# Patient Record
Sex: Female | Born: 1965
Health system: Southern US, Community
[De-identification: ages and names within clinical notes are randomized; demographics above are authoritative.]

## PROBLEM LIST (undated history)

## (undated) HISTORY — PX: BREAST CYST EXCISION: SHX579

---

## 1997-10-31 ENCOUNTER — Ambulatory Visit (HOSPITAL_COMMUNITY): Admission: RE | Admit: 1997-10-31 | Discharge: 1997-10-31 | Payer: Self-pay | Admitting: *Deleted

## 1998-06-30 ENCOUNTER — Ambulatory Visit (HOSPITAL_COMMUNITY): Admission: RE | Admit: 1998-06-30 | Discharge: 1998-06-30 | Payer: Self-pay | Admitting: Internal Medicine

## 1998-07-08 ENCOUNTER — Inpatient Hospital Stay (HOSPITAL_COMMUNITY): Admission: AD | Admit: 1998-07-08 | Discharge: 1998-07-09 | Payer: Self-pay | Admitting: Internal Medicine

## 2000-12-12 ENCOUNTER — Other Ambulatory Visit: Admission: RE | Admit: 2000-12-12 | Discharge: 2000-12-12 | Payer: Self-pay | Admitting: Internal Medicine

## 2004-10-27 ENCOUNTER — Encounter: Admission: RE | Admit: 2004-10-27 | Discharge: 2004-10-27 | Payer: Self-pay | Admitting: Internal Medicine

## 2005-08-04 ENCOUNTER — Other Ambulatory Visit: Admission: RE | Admit: 2005-08-04 | Discharge: 2005-08-04 | Payer: Self-pay | Admitting: Family Medicine

## 2007-01-04 ENCOUNTER — Other Ambulatory Visit: Admission: RE | Admit: 2007-01-04 | Discharge: 2007-01-04 | Payer: Self-pay | Admitting: Family Medicine

## 2007-02-24 ENCOUNTER — Encounter: Admission: RE | Admit: 2007-02-24 | Discharge: 2007-02-24 | Payer: Self-pay | Admitting: Family Medicine

## 2008-01-11 ENCOUNTER — Other Ambulatory Visit: Admission: RE | Admit: 2008-01-11 | Discharge: 2008-01-11 | Payer: Self-pay | Admitting: Family Medicine

## 2009-01-20 ENCOUNTER — Other Ambulatory Visit: Admission: RE | Admit: 2009-01-20 | Discharge: 2009-01-20 | Payer: Self-pay | Admitting: Family Medicine

## 2009-03-27 ENCOUNTER — Encounter: Admission: RE | Admit: 2009-03-27 | Discharge: 2009-03-27 | Payer: Self-pay | Admitting: Family Medicine

## 2009-04-03 ENCOUNTER — Ambulatory Visit: Payer: Self-pay | Admitting: Internal Medicine

## 2009-04-14 LAB — CBC WITH DIFFERENTIAL/PLATELET
Eosinophils Absolute: 0.3 10*3/uL (ref 0.0–0.5)
HCT: 35.8 % (ref 34.8–46.6)
MCH: 30.2 pg (ref 25.1–34.0)
MCHC: 33.2 g/dL (ref 31.5–36.0)
MONO#: 0.4 10*3/uL (ref 0.1–0.9)
MONO%: 5.3 % (ref 0.0–14.0)
RBC: 3.94 10*6/uL (ref 3.70–5.45)
RDW: 13.8 % (ref 11.2–14.5)
lymph#: 2.7 10*3/uL (ref 0.9–3.3)

## 2009-04-14 LAB — LACTATE DEHYDROGENASE: LDH: 140 U/L (ref 94–250)

## 2009-04-14 LAB — COMPREHENSIVE METABOLIC PANEL
ALT: 9 U/L (ref 0–35)
AST: 12 U/L (ref 0–37)
BUN: 10 mg/dL (ref 6–23)
CO2: 20 mEq/L (ref 19–32)
Chloride: 110 mEq/L (ref 96–112)
Glucose, Bld: 86 mg/dL (ref 70–99)
Potassium: 4.4 mEq/L (ref 3.5–5.3)

## 2010-01-22 ENCOUNTER — Other Ambulatory Visit: Admission: RE | Admit: 2010-01-22 | Discharge: 2010-01-22 | Payer: Self-pay | Admitting: Family Medicine

## 2010-04-17 ENCOUNTER — Encounter: Admission: RE | Admit: 2010-04-17 | Discharge: 2010-04-17 | Payer: Self-pay | Admitting: Family Medicine

## 2011-01-25 ENCOUNTER — Other Ambulatory Visit (HOSPITAL_COMMUNITY)
Admission: RE | Admit: 2011-01-25 | Discharge: 2011-01-25 | Disposition: A | Discharge status: Home / Self Care | Payer: 59 | Source: Ambulatory Visit | Attending: Family Medicine | Admitting: Family Medicine

## 2011-01-25 ENCOUNTER — Other Ambulatory Visit: Payer: Self-pay | Admitting: Family Medicine

## 2011-01-25 DIAGNOSIS — Z1231 Encounter for screening mammogram for malignant neoplasm of breast: Secondary | ICD-10-CM

## 2011-01-25 DIAGNOSIS — Z01419 Encounter for gynecological examination (general) (routine) without abnormal findings: Secondary | ICD-10-CM | POA: Insufficient documentation

## 2011-04-20 ENCOUNTER — Ambulatory Visit
Admission: RE | Admit: 2011-04-20 | Discharge: 2011-04-20 | Disposition: A | Discharge status: Home / Self Care | Payer: 59 | Source: Ambulatory Visit | Attending: Family Medicine | Admitting: Family Medicine

## 2011-04-20 DIAGNOSIS — Z1231 Encounter for screening mammogram for malignant neoplasm of breast: Secondary | ICD-10-CM

## 2013-03-21 ENCOUNTER — Other Ambulatory Visit: Payer: Self-pay

## 2013-03-21 DIAGNOSIS — Z1231 Encounter for screening mammogram for malignant neoplasm of breast: Secondary | ICD-10-CM

## 2013-04-16 ENCOUNTER — Ambulatory Visit
Admission: RE | Admit: 2013-04-16 | Discharge: 2013-04-16 | Disposition: A | Discharge status: Home / Self Care | Payer: BC Managed Care – PPO | Source: Ambulatory Visit

## 2013-04-16 DIAGNOSIS — Z1231 Encounter for screening mammogram for malignant neoplasm of breast: Secondary | ICD-10-CM

## 2014-02-21 ENCOUNTER — Other Ambulatory Visit: Payer: Self-pay | Admitting: Family Medicine

## 2014-02-21 ENCOUNTER — Other Ambulatory Visit (HOSPITAL_COMMUNITY)
Admission: RE | Admit: 2014-02-21 | Discharge: 2014-02-21 | Disposition: A | Discharge status: Home / Self Care | Payer: BC Managed Care – PPO | Source: Ambulatory Visit | Attending: Family Medicine | Admitting: Family Medicine

## 2014-02-21 DIAGNOSIS — Z0000 Encounter for general adult medical examination without abnormal findings: Secondary | ICD-10-CM | POA: Insufficient documentation

## 2014-02-27 LAB — CYTOLOGY - PAP

## 2014-04-19 ENCOUNTER — Other Ambulatory Visit: Payer: Self-pay

## 2014-04-19 DIAGNOSIS — Z1231 Encounter for screening mammogram for malignant neoplasm of breast: Secondary | ICD-10-CM

## 2014-04-26 ENCOUNTER — Ambulatory Visit
Admission: RE | Admit: 2014-04-26 | Discharge: 2014-04-26 | Disposition: A | Discharge status: Home / Self Care | Payer: BC Managed Care – PPO | Source: Ambulatory Visit

## 2014-04-26 DIAGNOSIS — Z1231 Encounter for screening mammogram for malignant neoplasm of breast: Secondary | ICD-10-CM

## 2015-04-21 ENCOUNTER — Other Ambulatory Visit: Payer: Self-pay

## 2015-04-21 DIAGNOSIS — Z1231 Encounter for screening mammogram for malignant neoplasm of breast: Secondary | ICD-10-CM

## 2015-04-28 ENCOUNTER — Ambulatory Visit
Admission: RE | Admit: 2015-04-28 | Discharge: 2015-04-28 | Disposition: A | Discharge status: Home / Self Care | Payer: BLUE CROSS/BLUE SHIELD | Source: Ambulatory Visit

## 2015-04-28 DIAGNOSIS — Z1231 Encounter for screening mammogram for malignant neoplasm of breast: Secondary | ICD-10-CM

## 2016-05-12 ENCOUNTER — Other Ambulatory Visit: Payer: Self-pay | Admitting: Family Medicine

## 2016-05-12 DIAGNOSIS — Z1231 Encounter for screening mammogram for malignant neoplasm of breast: Secondary | ICD-10-CM

## 2016-05-31 ENCOUNTER — Ambulatory Visit
Admission: RE | Admit: 2016-05-31 | Discharge: 2016-05-31 | Disposition: A | Discharge status: Home / Self Care | Payer: BLUE CROSS/BLUE SHIELD | Source: Ambulatory Visit | Attending: Family Medicine | Admitting: Family Medicine

## 2016-05-31 DIAGNOSIS — Z1231 Encounter for screening mammogram for malignant neoplasm of breast: Secondary | ICD-10-CM

## 2017-05-12 ENCOUNTER — Other Ambulatory Visit (HOSPITAL_COMMUNITY)
Admission: RE | Admit: 2017-05-12 | Discharge: 2017-05-12 | Disposition: A | Discharge status: Home / Self Care | Payer: BLUE CROSS/BLUE SHIELD | Source: Ambulatory Visit | Attending: Family Medicine | Admitting: Family Medicine

## 2017-05-12 ENCOUNTER — Other Ambulatory Visit: Payer: Self-pay | Admitting: Family Medicine

## 2017-05-12 DIAGNOSIS — Z124 Encounter for screening for malignant neoplasm of cervix: Secondary | ICD-10-CM | POA: Insufficient documentation

## 2017-05-16 ENCOUNTER — Other Ambulatory Visit: Payer: Self-pay | Admitting: Family Medicine

## 2017-05-16 DIAGNOSIS — Z1231 Encounter for screening mammogram for malignant neoplasm of breast: Secondary | ICD-10-CM

## 2017-05-16 LAB — CYTOLOGY - PAP: Diagnosis: NEGATIVE

## 2017-06-01 ENCOUNTER — Ambulatory Visit
Admission: RE | Admit: 2017-06-01 | Discharge: 2017-06-01 | Disposition: A | Discharge status: Home / Self Care | Payer: BLUE CROSS/BLUE SHIELD | Source: Ambulatory Visit | Attending: Family Medicine | Admitting: Family Medicine

## 2017-06-01 DIAGNOSIS — Z1231 Encounter for screening mammogram for malignant neoplasm of breast: Secondary | ICD-10-CM

## 2017-11-08 ENCOUNTER — Other Ambulatory Visit: Payer: Self-pay | Admitting: Occupational Medicine

## 2017-11-08 ENCOUNTER — Ambulatory Visit: Payer: Self-pay

## 2017-11-08 DIAGNOSIS — M25572 Pain in left ankle and joints of left foot: Secondary | ICD-10-CM

## 2017-11-15 DIAGNOSIS — M79672 Pain in left foot: Secondary | ICD-10-CM | POA: Insufficient documentation

## 2018-05-25 ENCOUNTER — Ambulatory Visit
Admission: RE | Admit: 2018-05-25 | Discharge: 2018-05-25 | Disposition: A | Discharge status: Home / Self Care | Payer: BLUE CROSS/BLUE SHIELD | Source: Ambulatory Visit | Attending: Family Medicine | Admitting: Family Medicine

## 2018-05-25 ENCOUNTER — Other Ambulatory Visit: Payer: Self-pay | Admitting: Family Medicine

## 2018-05-25 DIAGNOSIS — R0600 Dyspnea, unspecified: Secondary | ICD-10-CM

## 2019-03-16 ENCOUNTER — Ambulatory Visit (INDEPENDENT_AMBULATORY_CARE_PROVIDER_SITE_OTHER): Payer: BC Managed Care – PPO | Admitting: Pulmonary Disease

## 2019-03-16 ENCOUNTER — Encounter: Payer: Self-pay | Admitting: Pulmonary Disease

## 2019-03-16 ENCOUNTER — Other Ambulatory Visit: Payer: Self-pay

## 2019-03-16 VITALS — BP 130/72 | HR 100 | Temp 98.0°F | Ht 67.0 in | Wt 199.4 lb

## 2019-03-16 DIAGNOSIS — R062 Wheezing: Secondary | ICD-10-CM | POA: Diagnosis not present

## 2019-03-16 MED ORDER — BUDESONIDE-FORMOTEROL FUMARATE 160-4.5 MCG/ACT IN AERO: 2.0000 | INHALATION_SPRAY | Freq: Two times a day (BID) | RESPIRATORY_TRACT | 6 refills | Status: DC

## 2019-03-16 MED ORDER — PREDNISONE 20 MG PO TABS
ORAL_TABLET | ORAL | 0 refills | Status: DC
Start: 2019-03-16 — End: 2019-05-10

## 2019-03-16 MED ORDER — FLUTICASONE PROPIONATE 50 MCG/ACT NA SUSP: 2.0000 | Freq: Every day | NASAL | 5 refills | Status: AC

## 2019-03-16 MED ORDER — BUDESONIDE-FORMOTEROL FUMARATE 160-4.5 MCG/ACT IN AERO: 2.0000 | INHALATION_SPRAY | Freq: Two times a day (BID) | RESPIRATORY_TRACT | 0 refills | Status: DC

## 2019-03-16 NOTE — Patient Instructions (Signed)
Will increase your Symbicort to 160/4.5 Start Flonase nasal spray and Prilosec 40 mg once daily We will give prednisone 40 mg/day for 5 days  Check CBC with differential, IgE, PFTs Follow-up in 1 to 2 months.

## 2019-03-16 NOTE — Progress Notes (Signed)
Donna Mahoney    [id number]    May 10, 1966  Primary Care Physician:Reade, Herbie [place], MD  Referring Physician: Maury Dus, [address] [place],  [place] [address]  Chief complaint: Consult for asthma  HPI: 53 year old with history of eczema, plantar fasciitis Sent here for evaluation of persistent wheezing Chest seen by primary care in March with wheezing. Started on Singulair with no improvement in symptoms.  This was stopped on follow-up visit and April and started on Symbicort 80/4.5.  Patient states that she still symptomatic with daily symptoms of cough, white mucus, wheezing.  She does have seasonal allergies for which she is taking Xyzal and acid reflux.  Pets: No pets Occupation: Games developer Exposures: No known exposures, no mold, hot tub, Jacuzzi Smoking history: 10-pack-year smoker.  Quit in 2017 Travel history: No significant travel history Relevant family history: No significant family history of lung disease  ACQ-6 03/16/2019- 3.5  Outpatient Encounter Medications as of 03/16/2019  Medication Sig  . budesonide-formoterol (SYMBICORT) 80-4.5 MCG/ACT inhaler Inhale 2 puffs into the lungs 2 (two) times daily.   . montelukast (SINGULAIR) 10 MG tablet Take 10 mg by mouth daily.   No facility-administered encounter medications on file as of 03/16/2019.     Allergies as of 03/16/2019 - never reviewed  Allergen Reaction Noted  . Codeine Nausea And Vomiting 03/16/2019    History reviewed. No pertinent past medical history.  Past Surgical History:  Procedure Laterality Date  . BREAST CYST EXCISION Left    When she was 53 years old    Family History  Problem Relation Age of Onset  . Kidney disease Mother   . Breast cancer Sister     Social History   Socioeconomic History  . Marital status: Married    Spouse name: Not on file  . Number of children: Not on file  . Years of education: Not on file  . Highest education  level: Not on file  Occupational History  . Not on file  Social Needs  . Financial resource strain: Not on file  . Food insecurity    Worry: Not on file    Inability: Not on file  . Transportation needs    Medical: Not on file    Non-medical: Not on file  Tobacco Use  . Smoking status: Former Smoker    Packs/day: 0.25    Years: 23.00    Pack years: 5.75    Types: Cigarettes    Quit date: 03/19/2016    Years since quitting: 2.9  . Smokeless tobacco: Never Used  Substance and Sexual Activity  . Alcohol use: Yes    Comment: occ.  . Drug use: Never  . Sexual activity: Not on file  Lifestyle  . Physical activity    Days per week: Not on file    Minutes per session: Not on file  . Stress: Not on file  Relationships  . Social Herbalist on phone: Not on file    Gets together: Not on file    Attends religious service: Not on file    Active member of club or organization: Not on file    Attends meetings of clubs or organizations: Not on file    Relationship status: Not on file  . Intimate partner violence    Fear of current or ex partner: Not on file    Emotionally abused: Not on file    Physically abused: Not  on file    Forced sexual activity: Not on file  Other Topics Concern  . Not on file  Social History Narrative  . Not on file   Review of systems: Review of Systems  Constitutional: Negative for fever and chills.  HENT: Negative.   Eyes: Negative for blurred vision.  Respiratory: as per HPI  Cardiovascular: Negative for chest pain and palpitations.  Gastrointestinal: Negative for vomiting, diarrhea, blood per rectum. Genitourinary: Negative for dysuria, urgency, frequency and hematuria.  Musculoskeletal: Negative for myalgias, back pain and joint pain.  Skin: Negative for itching and rash.  Neurological: Negative for dizziness, tremors, focal weakness, seizures and loss of consciousness.  Endo/Heme/Allergies: Negative for environmental allergies.   Psychiatric/Behavioral: Negative for depression, suicidal ideas and hallucinations.  All other systems reviewed and are negative.  Physical Exam: Blood pressure 130/72, pulse 100, temperature 98 F (36.7 C), temperature source Oral, height 5\' 7"  (1.702 m), weight 199 lb 6.4 oz (90.4 kg), SpO2 97 %. Gen:      No acute distress HEENT:  EOMI, sclera anicteric Neck:     No masses; no thyromegaly Lungs:    Bilateral expiratory wheeze CV:         Regular rate and rhythm; no murmurs Abd:      + bowel sounds; soft, non-tender; no palpable masses, no distension Ext:    No edema; adequate peripheral perfusion Skin:      Warm and dry; no rash Neuro: alert and oriented x 3 Psych: normal mood and affect  Data Reviewed: Imaging: Chest x-ray 05/25/2018- no acute cardiopulmonary disease.  I have reviewed the images personally.  Labs: CBC 04/14/2009-WBC 6.9, eos 4.3%, absolute eosinophil 297  Assessment:  Severe persistent asthma with exacerbation We will give her a prednisone course-40 mg for 5 days Increase Symbicort 160/4.5. Start Flonase nasal spray for seasonal allergies and Prilosec for acid reflux  Check CBC differential, IgE and PFTs If symptoms remain uncontrolled pain we will consider biologic therapy  Plan/Recommendations: - Prednisone - Increase Symbicort 160 - Flonase, Prilosec - Check CBC, IgE, PFTs  Marshell Garfinkel MD [facility] 03/16/2019, 3:57 PM  CC: Maury Dus, MD

## 2019-03-19 LAB — CBC WITH DIFFERENTIAL/PLATELET
Absolute Monocytes: 621 cells/uL (ref 200–950)
Basophils Absolute: 90 cells/uL (ref 0–200)
Basophils Relative: 1 %
Eosinophils Absolute: 576 cells/uL — ABNORMAL HIGH (ref 15–500)
Eosinophils Relative: 6.4 %
HCT: 41.5 % (ref 35.0–45.0)
Hemoglobin: 13.6 g/dL (ref 11.7–15.5)
Lymphs Abs: 3843 cells/uL (ref 850–3900)
MCH: 29.9 pg (ref 27.0–33.0)
MCHC: 32.8 g/dL (ref 32.0–36.0)
MCV: 91.2 fL (ref 80.0–100.0)
Monocytes Relative: 6.9 %
Neutro Abs: 3870 cells/uL (ref 1500–7800)
Neutrophils Relative %: 43 %
Platelets: 175 10*3/uL (ref 140–400)
RBC: 4.55 10*6/uL (ref 3.80–5.10)
RDW: 13.3 % (ref 11.0–15.0)
Total Lymphocyte: 42.7 %
WBC: 9 10*3/uL (ref 3.8–10.8)

## 2019-03-19 LAB — IGE: IgE (Immunoglobulin E), Serum: 88 kU/L (ref <=114)

## 2019-03-27 ENCOUNTER — Telehealth: Payer: Self-pay | Admitting: Pulmonary Disease

## 2019-03-27 NOTE — Telephone Encounter (Signed)
Pt returning call for results of IgE. Resulted in results section. Pt notified Nothing further needed.

## 2019-03-28 ENCOUNTER — Institutional Professional Consult (permissible substitution): Payer: Self-pay | Admitting: Pulmonary Disease

## 2019-04-17 ENCOUNTER — Telehealth: Payer: Self-pay | Admitting: Pulmonary Disease

## 2019-04-18 NOTE — Telephone Encounter (Signed)
Pt is returning call. Cb is [phone] (Cell)

## 2019-04-19 NOTE — Telephone Encounter (Signed)
Spoke to pt scheduled PFT on 05/10/2019-pr

## 2019-05-03 ENCOUNTER — Other Ambulatory Visit: Payer: Self-pay | Admitting: Pulmonary Disease

## 2019-05-07 ENCOUNTER — Other Ambulatory Visit (HOSPITAL_COMMUNITY): Payer: BC Managed Care – PPO | Attending: Pulmonary Disease

## 2019-05-07 ENCOUNTER — Other Ambulatory Visit: Payer: Self-pay

## 2019-05-07 DIAGNOSIS — Z20822 Contact with and (suspected) exposure to covid-19: Secondary | ICD-10-CM

## 2019-05-09 ENCOUNTER — Telehealth: Payer: Self-pay | Admitting: Family Medicine

## 2019-05-09 LAB — NOVEL CORONAVIRUS, NAA: SARS-CoV-2, NAA: NOT DETECTED

## 2019-05-09 NOTE — Telephone Encounter (Signed)
Negative COVID results given. Patient results "NOT Detected." Caller expressed understanding. ° °

## 2019-05-09 NOTE — Progress Notes (Signed)
@Patient  ID: Donna Mahoney, female    DOB: 03/29/1966, 53 y.o.   MRN: [id number]  Chief Complaint  Patient presents with  . Follow-up    PFT results SOB mostly with cough - coughs a lot (mininal phlegm - dark tan) - prednisone helped but now worse again    Referring provider: Maury Dus, MD  HPI:  53 year old female former smoker followed in our office for asthma  Smoker/ Smoking History: Former smoker.  Quit 2017.  5.75 pack years. Maintenance:  Symbicort 160 Pt of: Dr. Vaughan Browner  05/10/2019  - Visit   52 year old female former smoker presenting to our office today for further work-up regarding her cough, wheezing as well as suspected asthma.  Patient complete a pulmonary function test today.  Pulmonary function test results are listed below:  05/10/2019-pulmonary function test-FVC 1.90 (67% predicted), postbronchodilator ratio 83, postbronchodilator FEV1 1.52 (68% predicted), no bronchodilator response, mid flow reversibility, DLCO 14.60 (70% predicted)  Overall, patient reports that she was feeling well and the symptoms had significantly improved after last office visit with Dr. Vaughan Browner.  Patient was treated with prednisone as well as started on Symbicort 160.  Patient reports that she ran out of Symbicort 160 and did not realize she was supposed to be maintained on this.  She also did not know that she had a standing prescription at the pharmacy.  Patient reports that symptoms have worsened since coming off of the Symbicort inhaler.  Patient also does not have a rescue inhaler at home.  Patient has recent blood work from June/2020 that shows slightly elevated IgE of 88 as well as peripheral eosinophilia.   Tests:   05/07/2019-SARS-CoV-2-negative  03/16/2019-IgE-88 03/16/2019- eosinophils relative 6.4, eosinophils absolute 576  05/10/2019-pulmonary function test-FVC 1.90 (67% predicted), postbronchodilator ratio 83, postbronchodilator FEV1 1.52 (68% predicted), no  bronchodilator response, mid flow reversibility, DLCO 14.60 (70% predicted)  FENO:  No results found for: NITRICOXIDE  PFT: PFT Results Latest Ref Rng & Units 05/10/2019  FVC-Pre L 1.90  FVC-Predicted Pre % 67  FVC-Post L 1.84  FVC-Predicted Post % 65  Pre FEV1/FVC % % 76  Post FEV1/FCV % % 83  FEV1-Pre L 1.45  FEV1-Predicted Pre % 64  FEV1-Post L 1.52  DLCO UNC% % 70  DLCO COR %Predicted % 97  TLC L 3.89  TLC % Predicted % 76  RV % Predicted % 103    Imaging: No results found.    Specialty Problems      Pulmonary Problems   Severe persistent asthma with acute exacerbation    03/16/2019-IgE-88 03/16/2019- eosinophils relative 6.4, eosinophils absolute 576  05/10/2019-pulmonary function test-FVC 1.90 (67% predicted), postbronchodilator ratio 83, postbronchodilator FEV1 1.52 (68% predicted), no bronchodilator response, mid flow reversibility, DLCO 14.60 (70% predicted)           Allergies  Allergen Reactions  . Codeine Nausea And Vomiting    Immunization History  Administered Date(s) Administered  . Influenza-Unspecified 05/20/2017, 06/15/2018   Needs flu vaccine and Pneumovax 23 at next office visit if clinically stable.  History reviewed. No pertinent past medical history.  Tobacco History: Social History   Tobacco Use  Smoking Status Former Smoker  . Packs/day: 0.25  . Years: 23.00  . Pack years: 5.75  . Types: Cigarettes  . Quit date: 03/19/2016  . Years since quitting: 3.1  Smokeless Tobacco Never Used   Counseling given: Yes  Continue to not smoke  Outpatient Encounter Medications as of 05/10/2019  Medication Sig  . budesonide-formoterol (  SYMBICORT) 160-4.5 MCG/ACT inhaler Inhale 2 puffs into the lungs 2 (two) times daily.  . fluticasone (FLONASE) 50 MCG/ACT nasal spray Place 2 sprays into both nostrils daily.  . montelukast (SINGULAIR) 10 MG tablet Take 10 mg by mouth daily.  . [DISCONTINUED] budesonide-formoterol (SYMBICORT) 160-4.5  MCG/ACT inhaler Inhale 2 puffs into the lungs 2 (two) times daily.  . [DISCONTINUED] predniSONE (DELTASONE) 20 MG tablet Take 40 mg daily for 5 days  . albuterol (VENTOLIN HFA) 108 (90 Base) MCG/ACT inhaler Inhale 2 puffs into the lungs every 6 (six) hours as needed for wheezing or shortness of breath.  . budesonide-formoterol (SYMBICORT) 160-4.5 MCG/ACT inhaler Inhale 2 puffs into the lungs 2 (two) times daily.  . budesonide-formoterol (SYMBICORT) 80-4.5 MCG/ACT inhaler Inhale 2 puffs into the lungs 2 (two) times daily.   . predniSONE (DELTASONE) 20 MG tablet Take 2 tablets by mouth (40mg  daily) for 5 days. Take in the morning with food.   No facility-administered encounter medications on file as of 05/10/2019.      Review of Systems  Review of Systems  Constitutional: Positive for fatigue. Negative for activity change and fever.  HENT: Negative for sinus pressure, sinus pain and sore throat.   Respiratory: Positive for cough, chest tightness, shortness of breath and wheezing.   Cardiovascular: Negative for chest pain and palpitations.  Gastrointestinal: Negative for diarrhea, nausea and vomiting.  Musculoskeletal: Negative for arthralgias.  Neurological: Negative for dizziness.  Psychiatric/Behavioral: Negative for sleep disturbance. The patient is not nervous/anxious.      Physical Exam  BP 120/78 (BP Location: Right Arm, Patient Position: Sitting, Cuff Size: Normal)   Pulse (!) 101   Temp 98 F (36.7 C)   Ht 5\' 4"  (1.626 m)   Wt 199 lb (90.3 kg)   SpO2 95%   BMI 34.16 kg/m   Wt Readings from Last 5 Encounters:  05/10/19 199 lb (90.3 kg)  03/16/19 199 lb 6.4 oz (90.4 kg)     Physical Exam Vitals signs and nursing note reviewed.  Constitutional:      General: She is not in acute distress.    Appearance: Normal appearance. She is obese.  HENT:     Head: Normocephalic and atraumatic.     Right Ear: Tympanic membrane, ear canal and external ear normal. There is no  impacted cerumen.     Left Ear: Tympanic membrane, ear canal and external ear normal. There is no impacted cerumen.     Nose: Congestion and rhinorrhea present.     Mouth/Throat:     Mouth: Mucous membranes are moist.     Pharynx: Oropharynx is clear.     Comments: Postnasal drip Eyes:     Pupils: Pupils are equal, round, and reactive to light.  Neck:     Musculoskeletal: Normal range of motion.  Cardiovascular:     Rate and Rhythm: Normal rate and regular rhythm.     Pulses: Normal pulses.     Heart sounds: Normal heart sounds. No murmur.  Pulmonary:     Effort: Pulmonary effort is normal. No respiratory distress.     Breath sounds: No decreased air movement. Wheezing (Expiratory wheezes) present. No decreased breath sounds or rales.  Skin:    General: Skin is warm and dry.     Capillary Refill: Capillary refill takes less than 2 seconds.  Neurological:     General: No focal deficit present.     Mental Status: She is alert and oriented to person, place, and time. Mental  status is at baseline.     Gait: Gait normal.  Psychiatric:        Mood and Affect: Mood normal.        Behavior: Behavior normal.        Thought Content: Thought content normal.        Judgment: Judgment normal.      Lab Results:  CBC    Component Value Date/Time   WBC 9.0 03/16/2019 1619   RBC 4.55 03/16/2019 1619   HGB 13.6 03/16/2019 1619   HGB 11.9 04/14/2009 0934   HCT 41.5 03/16/2019 1619   HCT 35.8 04/14/2009 0934   PLT 175 03/16/2019 1619   PLT 145 04/14/2009 0934   MCV 91.2 03/16/2019 1619   MCV 90.9 04/14/2009 0934   MCH 29.9 03/16/2019 1619   MCHC 32.8 03/16/2019 1619   RDW 13.3 03/16/2019 1619   RDW 13.8 04/14/2009 0934   LYMPHSABS 3,843 03/16/2019 1619   LYMPHSABS 2.7 04/14/2009 0934   MONOABS 0.4 04/14/2009 0934   EOSABS 576 (H) 03/16/2019 1619   EOSABS 0.3 04/14/2009 0934   BASOSABS 90 03/16/2019 1619   BASOSABS 0.1 04/14/2009 0934    BMET    Component Value Date/Time    NA 140 04/14/2009 0934   K 4.4 04/14/2009 0934   CL 110 04/14/2009 0934   CO2 20 04/14/2009 0934   GLUCOSE 86 04/14/2009 0934   BUN 10 04/14/2009 0934   CREATININE 0.91 04/14/2009 0934   CALCIUM 9.0 04/14/2009 0934    BNP No results found for: BNP  ProBNP No results found for: PROBNP    Assessment & Plan:   Severe persistent asthma with acute exacerbation Plan: Prednisone 40 mg for 5 days Resume Symbicort 160, sample provided today Use spacer with Symbicort Prescription for rescue inhaler sent to pharmacy May need to consider biologic therapies in future based off of peripheral eosinophilia on blood work in June/2020 Follow-up with our office in 4 to 6 weeks to ensure symptoms are improving Flu vaccine as well as pneumonia vaccine at next office visit if stable and off of steroids.  Healthcare maintenance Plan: Needs flu vaccine in the fall Needs Pneumovax 23 at next office visit if clinically stable    Return in about 6 weeks (around 06/21/2019), or if symptoms worsen or fail to improve, for Follow up with Wyn Quaker FNP-C, Follow up with Dr. Vaughan Browner.   Lauraine Rinne, NP 05/10/2019   This appointment was 27 minutes long with over 50% of the time in direct face-to-face patient care, assessment, plan of care, and follow-up.

## 2019-05-10 ENCOUNTER — Ambulatory Visit (INDEPENDENT_AMBULATORY_CARE_PROVIDER_SITE_OTHER): Payer: BC Managed Care – PPO | Admitting: Pulmonary Disease

## 2019-05-10 ENCOUNTER — Ambulatory Visit: Payer: BC Managed Care – PPO | Admitting: Pulmonary Disease

## 2019-05-10 ENCOUNTER — Other Ambulatory Visit: Payer: Self-pay

## 2019-05-10 ENCOUNTER — Encounter: Payer: Self-pay | Admitting: Pulmonary Disease

## 2019-05-10 VITALS — BP 120/78 | HR 101 | Temp 98.0°F | Ht 64.0 in | Wt 199.0 lb

## 2019-05-10 DIAGNOSIS — Z0000 Encounter for general adult medical examination without abnormal findings: Secondary | ICD-10-CM

## 2019-05-10 DIAGNOSIS — R062 Wheezing: Secondary | ICD-10-CM

## 2019-05-10 DIAGNOSIS — J4551 Severe persistent asthma with (acute) exacerbation: Secondary | ICD-10-CM | POA: Diagnosis not present

## 2019-05-10 DIAGNOSIS — J4550 Severe persistent asthma, uncomplicated: Secondary | ICD-10-CM | POA: Insufficient documentation

## 2019-05-10 LAB — PULMONARY FUNCTION TEST
DL/VA % pred: 97 %
DL/VA: 4.17 ml/min/mmHg/L
DLCO cor % pred: 70 %
DLCO cor: 14.51 ml/min/mmHg
DLCO unc % pred: 70 %
DLCO unc: 14.6 ml/min/mmHg
FEF 25-75 Post: 1.49 L/sec
FEF 25-75 Pre: 1.11 L/sec
FEF2575-%Change-Post: 33 %
FEF2575-%Pred-Post: 64 %
FEF2575-%Pred-Pre: 48 %
FEV1-%Change-Post: 5 %
FEV1-%Pred-Post: 68 %
FEV1-%Pred-Pre: 64 %
FEV1-Post: 1.52 L
FEV1-Pre: 1.45 L
FEV1FVC-%Change-Post: 8 %
FEV1FVC-%Pred-Pre: 94 %
FEV6-%Change-Post: -3 %
FEV6-%Pred-Post: 67 %
FEV6-%Pred-Pre: 69 %
FEV6-Post: 1.84 L
FEV6-Pre: 1.9 L
FEV6FVC-%Pred-Post: 103 %
FEV6FVC-%Pred-Pre: 103 %
FVC-%Change-Post: -3 %
FVC-%Pred-Post: 65 %
FVC-%Pred-Pre: 67 %
FVC-Post: 1.84 L
FVC-Pre: 1.9 L
Post FEV1/FVC ratio: 83 %
Post FEV6/FVC ratio: 100 %
Pre FEV1/FVC ratio: 76 %
Pre FEV6/FVC Ratio: 100 %
RV % pred: 103 %
RV: 1.93 L
TLC % pred: 76 %
TLC: 3.89 L

## 2019-05-10 MED ORDER — PREDNISONE 20 MG PO TABS: ORAL_TABLET | ORAL | 10 refills | Status: DC

## 2019-05-10 MED ORDER — ALBUTEROL SULFATE HFA 108 (90 BASE) MCG/ACT IN AERS: 2.0000 | INHALATION_SPRAY | Freq: Four times a day (QID) | RESPIRATORY_TRACT | 2 refills | Status: DC | PRN

## 2019-05-10 MED ORDER — BUDESONIDE-FORMOTEROL FUMARATE 160-4.5 MCG/ACT IN AERO: 2.0000 | INHALATION_SPRAY | Freq: Two times a day (BID) | RESPIRATORY_TRACT | 0 refills | Status: DC

## 2019-05-10 NOTE — Progress Notes (Signed)
PFT done today. 

## 2019-05-10 NOTE — Assessment & Plan Note (Signed)
Plan: Prednisone 40 mg for 5 days Resume Symbicort 160, sample provided today Use spacer with Symbicort Prescription for rescue inhaler sent to pharmacy May need to consider biologic therapies in future based off of peripheral eosinophilia on blood work in [place] with our office in 4 to 6 weeks to ensure symptoms are improving Flu vaccine as well as pneumonia vaccine at next office visit if stable and off of steroids.

## 2019-05-10 NOTE — Assessment & Plan Note (Signed)
Plan: Needs flu vaccine in the fall Needs Pneumovax 23 at next office visit if clinically stable

## 2019-05-10 NOTE — Patient Instructions (Addendum)
You were seen today by Lauraine Rinne, NP  for:   1. Severe persistent asthma with acute exacerbation  Restart Symbicort 160 >>> 2 puffs in the morning right when you wake up, rinse out your mouth after use, 12 hours later 2 puffs, rinse after use >>> Take this daily, no matter what >>> This is not a rescue inhaler    Only use your albuterol as a rescue medication to be used if you can't catch your breath by resting or doing a relaxed purse lip breathing pattern.  - The less you use it, the better it will work when you need it. - Ok to use up to 2 puffs  every 4 hours if you must but call for immediate appointment if use goes up over your usual need - Don't leave home without it !!  (think of it like the spare tire for your car)    - predniSONE (DELTASONE) 20 MG tablet; Take 2 tablets by mouth (40mg  daily) for 5 days. Take in the morning with food.  Dispense: 2 tablet; Refill: 10   Continue Singulair daily   Continue Flonase   Flu vaccine and Pneumovax23 at next office visit   We recommend today:  No orders of the defined types were placed in this encounter.  No orders of the defined types were placed in this encounter.  Meds ordered this encounter  Medications  . predniSONE (DELTASONE) 20 MG tablet    Sig: Take 2 tablets by mouth (40mg  daily) for 5 days. Take in the morning with food.    Dispense:  2 tablet    Refill:  10  . budesonide-formoterol (SYMBICORT) 160-4.5 MCG/ACT inhaler    Sig: Inhale 2 puffs into the lungs 2 (two) times daily.    Dispense:  1 Inhaler    Refill:  0    Order Specific Question:   Lot Number?    Answer:   [id number]    Order Specific Question:   Expiration Date?    Answer:   11/19/2019    Order Specific Question:   Manufacturer?    Answer:   [employer] [[id number]]    Order Specific Question:   Quantity    Answer:   1    Comments:   sample  . albuterol (VENTOLIN HFA) 108 (90 Base) MCG/ACT inhaler    Sig: Inhale 2 puffs into the lungs every 6 (six)  hours as needed for wheezing or shortness of breath.    Dispense:  18 g    Refill:  2    Follow Up:    Return in about 6 weeks (around 06/21/2019), or if symptoms worsen or fail to improve, for Follow up with Wyn Quaker FNP-C, Follow up with Dr. Vaughan Browner.   Please do your part to reduce the spread of COVID-19:      Reduce your risk of any infection  and COVID19 by using the similar precautions used for avoiding the common cold or flu:  Marland Kitchen Wash your hands often with soap and warm water for at least 20 seconds.  If soap and water are not readily available, use an alcohol-based hand sanitizer with at least 60% alcohol.  . If coughing or sneezing, cover your mouth and nose by coughing or sneezing into the elbow areas of your shirt or coat, into a tissue or into your sleeve (not your hands). Langley Gauss A MASK when in public  . Avoid shaking hands with others and consider head nods or verbal greetings only. Marland Kitchen  Avoid touching your eyes, nose, or mouth with unwashed hands.  . Avoid close contact with people who are sick. . Avoid places or events with large numbers of people in one location, like concerts or sporting events. . If you have some symptoms but not all symptoms, continue to monitor at home and seek medical attention if your symptoms worsen. . If you are having a medical emergency, call 911.   [place] / e-Visit: [email or web address]         [facility]: [facility]: [phone]                   [facility]: [phone]     It is flu season:   >>> Best ways to protect herself from the flu: Receive the yearly flu vaccine, practice good hand hygiene washing with soap and also using hand sanitizer when available, eat a nutritious meals, get adequate rest, hydrate appropriately   Please contact the office if your symptoms worsen or  you have concerns that you are not improving.   Thank you for choosing [facility] for your healthcare, and for allowing [place] to partner with you on your healthcare journey. I am thankful to be able to provide care to you today.   Wyn Quaker FNP-C

## 2019-05-22 ENCOUNTER — Telehealth: Payer: Self-pay | Admitting: Pulmonary Disease

## 2019-05-22 NOTE — Telephone Encounter (Signed)
lmtcb x1 

## 2019-05-22 NOTE — Telephone Encounter (Signed)
Patient returned call and was informed: She needs to contact her insurance plan to find out what inhalers are on formulary in same class as Symbicort. Informed she needs to get cost of inhalers so that she can decide whether it's something she can afford. She is to then call office back with updated information. Nothing further needed at this time.

## 2019-05-23 ENCOUNTER — Telehealth: Payer: Self-pay | Admitting: Pulmonary Disease

## 2019-05-23 DIAGNOSIS — J4551 Severe persistent asthma with (acute) exacerbation: Secondary | ICD-10-CM

## 2019-05-23 NOTE — Telephone Encounter (Signed)
Pt states Symbicort is to expensive. She did call her insurance company. She says Asmanex is on formulary and wants to know if medication can be changed to that?

## 2019-05-24 NOTE — Telephone Encounter (Signed)
Pending provider response 

## 2019-05-25 NOTE — Telephone Encounter (Signed)
Asmanex is not adequate treatment for severe asthma Please refer to pharmacy so that they can evaluate options for her.  She will need to be on inhaled corticosteroid and LABA

## 2019-05-25 NOTE — Telephone Encounter (Signed)
Contacted patient by phone and notified her that the pharmacy team would be contacting her to assist in finding an affordable inhaler to manage her asthma.  Patient acknowledged understanding and had no further questions.  Referral placed to pharmacy. Will close encounter.

## 2019-05-30 ENCOUNTER — Telehealth: Payer: Self-pay | Admitting: Pulmonary Disease

## 2019-05-30 DIAGNOSIS — R062 Wheezing: Secondary | ICD-10-CM

## 2019-05-30 DIAGNOSIS — J4551 Severe persistent asthma with (acute) exacerbation: Secondary | ICD-10-CM

## 2019-05-30 MED ORDER — BUDESONIDE-FORMOTEROL FUMARATE 160-4.5 MCG/ACT IN AERO: 2.0000 | INHALATION_SPRAY | Freq: Two times a day (BID) | RESPIRATORY_TRACT | 0 refills | Status: DC

## 2019-05-30 NOTE — Telephone Encounter (Signed)
Patient was given a sample of Symbicort 160 during OV with Dr. Vaughan Browner on 05/10/19. There is a telephone note dated 05/23/19 stating the patient was told Asmanex would not be appropriate for her and she will be contacted by pharmacy team. There are no current appointments scheduled with pharmacy team at this time. However, she does have an appointment with Dr. Vaughan Browner 06/21/19.  I left a message for the patient to pick up a sample of Symbicort 160/4.5 mg from front desk, which should help her until she has been in contact with pharmacy team.  Asked for patient to call back if there are any other questions. Nothing further needed at this time.

## 2019-06-21 ENCOUNTER — Other Ambulatory Visit: Payer: Self-pay

## 2019-06-21 ENCOUNTER — Encounter: Payer: Self-pay | Admitting: Pulmonary Disease

## 2019-06-21 ENCOUNTER — Ambulatory Visit (INDEPENDENT_AMBULATORY_CARE_PROVIDER_SITE_OTHER): Payer: BC Managed Care – PPO

## 2019-06-21 ENCOUNTER — Ambulatory Visit: Payer: BC Managed Care – PPO | Admitting: Pulmonary Disease

## 2019-06-21 VITALS — BP 118/74 | HR 88 | Temp 97.6°F | Ht 66.0 in | Wt 202.0 lb

## 2019-06-21 DIAGNOSIS — J4551 Severe persistent asthma with (acute) exacerbation: Secondary | ICD-10-CM | POA: Diagnosis not present

## 2019-06-21 DIAGNOSIS — Z23 Encounter for immunization: Secondary | ICD-10-CM

## 2019-06-21 MED ORDER — BUDESONIDE-FORMOTEROL FUMARATE 160-4.5 MCG/ACT IN AERO: 2.0000 | INHALATION_SPRAY | Freq: Two times a day (BID) | RESPIRATORY_TRACT | 0 refills | Status: DC

## 2019-06-21 NOTE — Addendum Note (Signed)
Addended by: Hildred Alamin I on: 06/21/2019 09:34 AM   Modules accepted: Orders

## 2019-06-21 NOTE — Patient Instructions (Addendum)
Continue the Symbicort.  Please check with your pharmacy if there is any other similar medication which may be cheaper and we can switch to that Continue Singulair, antiacid medication We will give vaccination today  Follow-up in 3 months.

## 2019-06-21 NOTE — Addendum Note (Signed)
Addended by: Hildred Alamin I on: 06/21/2019 09:33 AM   Modules accepted: Orders

## 2019-06-21 NOTE — Progress Notes (Signed)
Donna Mahoney    [id number]    1966/01/27  Primary Care Physician:Reade, Herbie [place], MD  Referring Physician: Maury Dus, [place],  [place] [address]  Chief complaint: Follow up for asthma  HPI: 53 year old with history of eczema, plantar fasciitis Sent here for evaluation of persistent wheezing Chest seen by primary care in March with wheezing. Started on Singulair with no improvement in symptoms.  This was stopped on follow-up visit and April and started on Symbicort 80/4.5.  Patient states that she still symptomatic with daily symptoms of cough, white mucus, wheezing.  She does have seasonal allergies for which she is taking Xyzal and acid reflux.  Pets: No pets Occupation: Games developer Exposures: No known exposures, no mold, hot tub, Jacuzzi Smoking history: 10-pack-year smoker.  Quit in 2017 Travel history: No significant travel history Relevant family history: No significant family history of lung disease  ACQ-6 03/16/2019- 3.5 ACT- 06/21/2019-21  Interim history: Got 2 rounds of prednisone over the past few months for asthma.  Her symptoms are finally under control. We increased her Symbicort 160.  She is using this 2 puffs twice daily Continues on Singulair, antiacid medication.  Outpatient Encounter Medications as of 06/21/2019  Medication Sig  . albuterol (VENTOLIN HFA) 108 (90 Base) MCG/ACT inhaler Inhale 2 puffs into the lungs every 6 (six) hours as needed for wheezing or shortness of breath.  . budesonide-formoterol (SYMBICORT) 160-4.5 MCG/ACT inhaler Inhale 2 puffs into the lungs 2 (two) times daily.  . fluticasone (FLONASE) 50 MCG/ACT nasal spray Place 2 sprays into both nostrils daily.  . montelukast (SINGULAIR) 10 MG tablet Take 10 mg by mouth daily.  . [DISCONTINUED] budesonide-formoterol (SYMBICORT) 160-4.5 MCG/ACT inhaler Inhale 2 puffs into the lungs 2 (two) times daily.  . [DISCONTINUED]  budesonide-formoterol (SYMBICORT) 160-4.5 MCG/ACT inhaler Inhale 2 puffs into the lungs 2 (two) times daily.  . [DISCONTINUED] budesonide-formoterol (SYMBICORT) 80-4.5 MCG/ACT inhaler Inhale 2 puffs into the lungs 2 (two) times daily.   . [DISCONTINUED] predniSONE (DELTASONE) 20 MG tablet Take 2 tablets by mouth (40mg  daily) for 5 days. Take in the morning with food.   No facility-administered encounter medications on file as of 06/21/2019.    Physical Exam: Blood pressure 130/72, pulse 100, temperature 98 F (36.7 C), temperature source Oral, height 5\' 7"  (1.702 m), weight 199 lb 6.4 oz (90.4 kg), SpO2 97 %. Gen:      No acute distress HEENT:  EOMI, sclera anicteric Neck:     No masses; no thyromegaly Lungs:    Bilateral expiratory wheeze CV:         Regular rate and rhythm; no murmurs Abd:      + bowel sounds; soft, non-tender; no palpable masses, no distension Ext:    No edema; adequate peripheral perfusion Skin:      Warm and dry; no rash Neuro: alert and oriented x 3 Psych: normal mood and affect  Data Reviewed: Imaging: Chest x-ray 05/25/2018- no acute cardiopulmonary disease.  I have reviewed the images personally.  PFTs: 05/10/2019 FVC 1.84 [65%], FEV1 1.52 [68%], F/F 83, TLC 3.89 [76%], DLCO 14.6 [70%] Small airways disease with air trapping.  Mild restriction and diffusion impairment.  Labs: CBC 04/14/2009-WBC 6.9, eos 4.3%, absolute eosinophil 297 CBC 6-20 6/20-WBC 9, eos 6.4%, absolute eosinophil count 576 IgE 03/16/2019-88  Assessment:  Severe persistent asthma T2 high phenotype Continue Symbicort 160/4.5.  She feels that this inhaler is too expensive.  She will check with her insurance and pharmacy regarding preferred inhalers and get back to [place]. Flonase, Singulair for allergic rhinitis Prilosec for acid reflux  PFTs reviewed with no overt obstruction but evidence of small airways disease given curvature, improvement in mid flow lesions and air trapping.  There is  mild restriction in diffusion impairment.  Suspicion for interstitial lung disease is low. Get chest x-ray today If symptoms get out of control after maximal inhaler therapy then consider Biologics.  Health maintenance Flu vaccine, Pneumovax today  Plan/Recommendations: - Symbicort, albuterol as needed - Flonase, Singulair, Prilosec - Chest x-ray - Flu, Pneumovax  Marshell Garfinkel MD [facility] 06/21/2019, 9:02 AM  CC: Maury Dus, MD

## 2019-06-22 ENCOUNTER — Telehealth: Payer: Self-pay | Admitting: Pulmonary Disease

## 2019-06-22 NOTE — Telephone Encounter (Signed)
Called and spoke w/ pt regarding her CXR results from 06/21/2019 per Dr. Vaughan Browner. Pt verbalized understanding to these results with no additional questions or concerns. I have updated the result note to reflect this. Nothing further needed at this time.

## 2019-07-10 ENCOUNTER — Telehealth: Payer: Self-pay | Admitting: Pharmacy Technician

## 2019-07-10 NOTE — Telephone Encounter (Signed)
Findings of benefits investigation via test claims at [facility]:   Insurance: [employer]   Symbicort 160mg  - # 1 inhaler for a 1 month supply through patient's insurance is $ 45.00.  Budesonide-Formoterol 160mg - (Generic for Symbicort) Non-formulary- brand preferred.   Dulera - # 1 inhaler for a 1 month supply through patient's insurance is $ 45.00.  Advair 250- # 1 inhaler for a 1 month supply through patient's insurance is $ 10.00.   *Patient has Pharmacist, community. Ruthe Mannan has a copay card available to may copays no more than $15/month.   3:45 PM Beatriz Chancellor, CPhT

## 2019-07-10 NOTE — Progress Notes (Deleted)
Pharmacy Note  Subjective:  Patient presents today to [facility] to see pharmacy team for inhaler optimization and education.  Past medical history includes severe persistent asthma and eczema.  She is a former smoker with a 10 pack year history and quit in 2017.  Her current asthma regimen includes Symbicort, Ventolin, Flonase, and Singulair (Prilosec?).  She has received 2 prednisone prescriptions since June.  Triggers: *** Coughing at night ***days/week Coughing with exertion *** Chest pain*** ER visits in last 6 months *** Hospitalizations ***  Asthma Control (past 4 weeks)  Asthma gets in the way of finishing tasks (school, work, home): {How much time:3044014::"all of the time","most of the time","some of the time","a little of the time","none of the time"}  Shortness of breath: {How often:3044014::"more than once a day","once a day","3-6 times per week","once or twice a week","not at all"}    Symptoms causing nighttime awakening: {How often:3044014::"4 or more nights/week","2-3 nights/week","once a week","1-2 nights/month","not at all"}   Frequency of rescue inhaler use: {How often:3044014::"3 or more times/day","1-2 times/day","2-3 times/week","once a week or less","not at all"}   Asthma control: {Rating:3044014::"not controlled","poor","somewhat","well","completely"}   Objective: Outpatient Encounter Medications as of 07/13/2019  Medication Sig  . albuterol (VENTOLIN HFA) 108 (90 Base) MCG/ACT inhaler Inhale 2 puffs into the lungs every 6 (six) hours as needed for wheezing or shortness of breath.  . budesonide-formoterol (SYMBICORT) 160-4.5 MCG/ACT inhaler Inhale 2 puffs into the lungs 2 (two) times daily.  . budesonide-formoterol (SYMBICORT) 160-4.5 MCG/ACT inhaler Inhale 2 puffs into the lungs 2 (two) times daily.  . fluticasone (FLONASE) 50 MCG/ACT nasal spray Place 2 sprays into both nostrils daily.  . montelukast (SINGULAIR) 10 MG tablet Take 10 mg by mouth daily.   No  facility-administered encounter medications on file as of 07/13/2019.    Immunization History  Administered Date(s) Administered  . Influenza,inj,Quad PF,6+ Mos 06/21/2019  . Influenza-Unspecified 05/20/2017, 06/15/2018  . Pneumococcal Polysaccharide-23 06/21/2019   FEV1 1.52 [68%] on 05/10/2019  CBC WBC 9, eos 6.4%, absolute eosinophil count 576 and IgE 88 on 03/16/2019  Chest x-ray Lungs clear and no active cardiopulmonary disease on 06/21/2019  Assessment/Plan:  1) Inhaler Optimization

## 2019-07-13 ENCOUNTER — Ambulatory Visit (INDEPENDENT_AMBULATORY_CARE_PROVIDER_SITE_OTHER): Payer: BC Managed Care – PPO | Admitting: Pharmacist

## 2019-07-13 ENCOUNTER — Other Ambulatory Visit: Payer: Self-pay

## 2019-07-13 ENCOUNTER — Telehealth: Payer: Self-pay | Admitting: Pharmacist

## 2019-07-13 DIAGNOSIS — J4551 Severe persistent asthma with (acute) exacerbation: Secondary | ICD-10-CM

## 2019-07-13 MED ORDER — FLUTICASONE-SALMETEROL 250-50 MCG/DOSE IN AEPB: 1.0000 | INHALATION_SPRAY | Freq: Two times a day (BID) | RESPIRATORY_TRACT | 11 refills | Status: AC

## 2019-07-13 NOTE — Progress Notes (Addendum)
Subjective:  Patient presents today to [facility] to see pharmacy team for inhaler optimization and education.  Past medical history includes severe persistent asthma and eczema.  She is a former smoker with a 10 pack year history and quit in 2017.  Her current asthma regimen includes Symbicort, Ventolin, Flonase, and Singulair.  She has received 2 prednisone prescriptions since June.  Pt presents today for initial appt with pharmacy team. When asked about Prilosec prescription, pt is not sure what it is used for and states she is not taking it. She states she thinks Dr. Vaughan Browner may have prescribed it. She states she has been coughing more and attributes cough to weather/allergies.  Triggers: not taking medicine Coughing at night 0 days/week Coughing with exertion: "every now and then I have a coughing spell"; specifically has coughing spells 1-2x/day (just started past 3 days; pt thinks it is due to weather/allergies). Chest pain: none  ER visits in last 6 months: none  Hospitalizations: none   Asthma Control (past 4 weeks)             Asthma gets in the way of finishing tasks (school, work, home): none of the time             Shortness of breath: one a week              Symptoms causing nighttime awakening:none              Frequency of rescue inhaler use:1-2 times per day             Asthma control: well   Objective:     Outpatient Encounter Medications as of 07/13/2019  Medication Sig  . albuterol (VENTOLIN HFA) 108 (90 Base) MCG/ACT inhaler Inhale 2 puffs into the lungs every 6 (six) hours as needed for wheezing or shortness of breath.  . budesonide-formoterol (SYMBICORT) 160-4.5 MCG/ACT inhaler Inhale 2 puffs into the lungs 2 (two) times daily.  . budesonide-formoterol (SYMBICORT) 160-4.5 MCG/ACT inhaler Inhale 2 puffs into the lungs 2 (two) times daily.  . fluticasone (FLONASE) 50 MCG/ACT nasal spray Place 2 sprays into both nostrils daily.  . montelukast (SINGULAIR)  10 MG tablet Take 10 mg by mouth daily.   No facility-administered encounter medications on file as of 07/13/2019.     Immunization History  Administered Date(s) Administered  . Influenza,inj,Quad PF,6+ Mos 06/21/2019  . Influenza-Unspecified 05/20/2017, 06/15/2018  . Pneumococcal Polysaccharide-23 06/21/2019   FEV1 1.52 [68%] on 05/10/2019  CBC WBC 9, eos 6.4%, absolute eosinophil count 576 and IgE 88 on 03/16/2019  Chest x-ray Lungs clear and no active cardiopulmonary disease on 06/21/2019  Assessment/Plan:  1) Inhaler Optimization (Advair Diskus 250/50 mcg one inhalation twice daily) Will switch patient from Symbicort to Advair Diskus due to cost (Symbicort is $45/month vs Advair Diskus is $10/month).   Patient was counseled on the purpose, proper use, and adverse effects of Advair Diskus inhaler.  Instructed patient to rinse mouth with water after using in order to prevent fungal infection.  Patient verbalized understanding.  Reviewed appropriate use of maintenance vs rescue inhalers.  Stressed importance of using maintenance inhaler daily and rescue inhaler only as needed.  Patient verbalized understanding.  Demonstrated proper inhaler technique using Advair Diskus demo inhaler.  Patient able to demonstrate proper inhaler technique using teach back method. Patient education handout provided.   2) Health Maintenance Patient received flu vaccine and Pneumovax 23 on 06/21/2019.  She is up to date with vaccines indicated  for asthma.  Thank you for involving pharmacy to assist in providing Ms. Mysliwiec's care.   Drexel Iha, PharmD PGY2 Ambulatory Care Pharmacy Resident  I evaluated the patient with Drexel Iha, PharmD. I am in agreement with the plan of care discussed and noted above.  Mariella Saa, PharmD, [place], [place] Clinical Specialty Pharmacist [phone]  07/13/2019 11:03 AM

## 2019-07-13 NOTE — Telephone Encounter (Signed)
Called [facility] pharmacy on 07/13/19 at 12:10 PM. Pharmacy staff member confirmed that Advair Diskus prescription had a $10 copay and would ready for pick up in 30 minutes to 1 hour.  Called pt on 07/13/19 at 12:13 PM. Confirmed $10 copay and notified pt when prescription would be ready.  Drexel Iha, PharmD PGY2 Ambulatory Care Pharmacy Resident

## 2019-07-13 NOTE — Patient Instructions (Addendum)
It was a pleasure seeing you in clinic today Donna Mahoney!  Today the plan is...  1. START fluticasone/salmeterol (Advair Diskus) 250/50 inhaler one inhalation twice daily (about 12 hours apart).  --Remember to hold like a hamburger --Breathe in FAST and HARD (like a cookout milkshake) --Wash your mouth out afterwards  2. STOP budesonide/formoterol (Symbicort) once you pick up Advair Diskus  3. Continue albuterol inhaler ("rescue inhaler") as needed. Try to make a note of how often you use it per week.   Please call the PharmD clinic at [phone] if you have any questions that you would like to speak with a pharmacist about Donna Mahoney, Museum/gallery conservator).

## 2019-09-03 ENCOUNTER — Other Ambulatory Visit: Payer: Self-pay | Admitting: Family Medicine

## 2019-09-03 DIAGNOSIS — Z1231 Encounter for screening mammogram for malignant neoplasm of breast: Secondary | ICD-10-CM

## 2019-09-05 ENCOUNTER — Other Ambulatory Visit: Payer: Self-pay

## 2019-09-05 ENCOUNTER — Ambulatory Visit
Admission: RE | Admit: 2019-09-05 | Discharge: 2019-09-05 | Disposition: A | Discharge status: Home / Self Care | Payer: BC Managed Care – PPO | Source: Ambulatory Visit | Attending: Family Medicine | Admitting: Family Medicine

## 2019-09-05 DIAGNOSIS — Z1231 Encounter for screening mammogram for malignant neoplasm of breast: Secondary | ICD-10-CM

## 2019-12-10 ENCOUNTER — Ambulatory Visit: Payer: Self-pay | Attending: Internal Medicine

## 2019-12-10 DIAGNOSIS — Z23 Encounter for immunization: Secondary | ICD-10-CM

## 2019-12-10 NOTE — Progress Notes (Signed)
   Covid-19 Vaccination Clinic  Name:  Donna Mahoney    MRN: [id number] DOB: 05/16/1966  12/10/2019  [name] was observed post Covid-19 immunization for 15 minutes without incident. She was provided with Vaccine Information Sheet and instruction to access the V-Safe system.   [name] was instructed to call 911 with any severe reactions post vaccine: Marland Kitchen Difficulty breathing  . Swelling of face and throat  . A fast heartbeat  . A bad rash all over body  . Dizziness and weakness   Immunizations Administered    Name Date Dose VIS Date Route   Pfizer COVID-19 Vaccine 12/10/2019 11:03 AM 0.3 mL 08/31/2019 Intramuscular   Manufacturer: [place]   Lot: [id number]   [place]: [id number]

## 2019-12-27 ENCOUNTER — Other Ambulatory Visit: Payer: Self-pay | Admitting: Physician Assistant

## 2019-12-27 DIAGNOSIS — R1084 Generalized abdominal pain: Secondary | ICD-10-CM

## 2020-01-02 ENCOUNTER — Ambulatory Visit
Admission: RE | Admit: 2020-01-02 | Discharge: 2020-01-02 | Disposition: A | Discharge status: Home / Self Care | Payer: BC Managed Care – PPO | Source: Ambulatory Visit | Attending: Physician Assistant | Admitting: Physician Assistant

## 2020-01-02 DIAGNOSIS — R1084 Generalized abdominal pain: Secondary | ICD-10-CM

## 2020-01-07 ENCOUNTER — Ambulatory Visit: Payer: BC Managed Care – PPO | Attending: Internal Medicine

## 2020-01-07 DIAGNOSIS — Z23 Encounter for immunization: Secondary | ICD-10-CM

## 2020-01-07 NOTE — Progress Notes (Signed)
   Covid-19 Vaccination Clinic  Name:  Loralye Reffner    MRN: [id number] DOB: 1966-09-01  01/07/2020  Ms. Downhour was observed post Covid-19 immunization for 15 minutes without incident. She was provided with Vaccine Information Sheet and instruction to access the V-Safe system.   Ms. Stetler was instructed to call 911 with any severe reactions post vaccine: Marland Kitchen Difficulty breathing  . Swelling of face and throat  . A fast heartbeat  . A bad rash all over body  . Dizziness and weakness   Immunizations Administered    Name Date Dose VIS Date Route   Pfizer COVID-19 Vaccine 01/07/2020  9:25 AM 0.3 mL 11/14/2018 Intramuscular   Manufacturer: [place]   Lot: [id number]   [place]: [id number]

## 2020-03-04 ENCOUNTER — Other Ambulatory Visit: Payer: Self-pay | Admitting: Pulmonary Disease

## 2020-07-15 ENCOUNTER — Other Ambulatory Visit (HOSPITAL_COMMUNITY)
Admission: RE | Admit: 2020-07-15 | Discharge: 2020-07-15 | Disposition: A | Discharge status: Home / Self Care | Payer: BC Managed Care – PPO | Source: Ambulatory Visit | Attending: Family Medicine | Admitting: Family Medicine

## 2020-07-15 ENCOUNTER — Other Ambulatory Visit: Payer: Self-pay | Admitting: Family Medicine

## 2020-07-15 DIAGNOSIS — Z124 Encounter for screening for malignant neoplasm of cervix: Secondary | ICD-10-CM | POA: Diagnosis present

## 2020-07-17 LAB — CYTOLOGY - PAP
Comment: NEGATIVE
Diagnosis: NEGATIVE
High risk HPV: NEGATIVE

## 2020-09-02 ENCOUNTER — Telehealth: Payer: Self-pay | Admitting: Pulmonary Disease

## 2020-09-02 NOTE — Telephone Encounter (Signed)
Called and spoke to pt. Informed her of the recs per Rachael. Pt verbalized understanding and states her most recent pick up from the pharmacy was $40, and it was $25 prior to that pick up. Pt will call insurance and see why the cost change and also inquire about the cost for next month, new year. Pt will call back with update and if she needs to start pt assistance for the next year. Will keep encounter open to follow up on.

## 2020-09-02 NOTE — Telephone Encounter (Signed)
Pt returning call - routing to pharmacy team - regarding Advair pricing -

## 2020-09-02 NOTE — Telephone Encounter (Signed)
Ran test claim, patient's copay is $25 for 1 month supply. Patient would need to reach out to her plan to discuss the copay/plan change.  Until further notice, pharmacy team is no longer be able to accommodate inhaler benefits investigations. We are focusing on strictly Specialty medications. Sorry for the inconvenience!

## 2020-09-02 NOTE — Telephone Encounter (Signed)
lmtcb for pt.  Per previous phone notes, pts Advair should only be $10/30 day supply. May need to ask pharmacy what issue is, after speaking with pt.

## 2020-09-08 ENCOUNTER — Telehealth: Payer: Self-pay | Admitting: Pulmonary Disease

## 2020-09-08 NOTE — Telephone Encounter (Signed)
lmtcb for pt.  

## 2020-09-10 NOTE — Telephone Encounter (Signed)
Pt returning call - CB# [phone]

## 2020-09-10 NOTE — Telephone Encounter (Signed)
See phone note from 12.14.21. Nothing further needed.

## 2020-09-10 NOTE — Telephone Encounter (Signed)
Spoke with the pt  She states that she did speak with her insurance company, and they did not give her a reason why the price of her advair went up  She has a coupon card that she will try using on her next refill, and will call us if any issues  I advised that we could try for pt assistance if she prefers, or she could ask her insurance for cheaper alternatives  She verbalized understanding and will call back if needed

## 2020-09-10 NOTE — Telephone Encounter (Signed)
Lmtcb for pt to see if she spoke with insurance yet.

## 2020-10-20 ENCOUNTER — Other Ambulatory Visit: Payer: Self-pay | Admitting: Family Medicine

## 2020-10-20 DIAGNOSIS — Z1231 Encounter for screening mammogram for malignant neoplasm of breast: Secondary | ICD-10-CM

## 2020-12-04 ENCOUNTER — Inpatient Hospital Stay: Admission: RE | Admit: 2020-12-04 | Payer: BC Managed Care – PPO | Source: Ambulatory Visit

## 2020-12-10 ENCOUNTER — Ambulatory Visit
Admission: RE | Admit: 2020-12-10 | Discharge: 2020-12-10 | Disposition: A | Discharge status: Home / Self Care | Payer: BC Managed Care – PPO | Source: Ambulatory Visit | Attending: Family Medicine | Admitting: Family Medicine

## 2020-12-10 ENCOUNTER — Other Ambulatory Visit: Payer: Self-pay | Admitting: Family Medicine

## 2020-12-10 DIAGNOSIS — R059 Cough, unspecified: Secondary | ICD-10-CM

## 2020-12-23 ENCOUNTER — Other Ambulatory Visit: Payer: Self-pay | Admitting: *Deleted

## 2020-12-25 ENCOUNTER — Other Ambulatory Visit: Payer: Self-pay | Admitting: Pulmonary Disease

## 2020-12-25 ENCOUNTER — Other Ambulatory Visit: Payer: Self-pay

## 2020-12-25 ENCOUNTER — Ambulatory Visit (INDEPENDENT_AMBULATORY_CARE_PROVIDER_SITE_OTHER): Payer: BC Managed Care – PPO | Admitting: Pulmonary Disease

## 2020-12-25 ENCOUNTER — Encounter: Payer: Self-pay | Admitting: Pulmonary Disease

## 2020-12-25 VITALS — BP 122/66 | HR 90 | Temp 97.3°F | Ht 66.0 in | Wt 212.4 lb

## 2020-12-25 DIAGNOSIS — R059 Cough, unspecified: Secondary | ICD-10-CM

## 2020-12-25 DIAGNOSIS — J4551 Severe persistent asthma with (acute) exacerbation: Secondary | ICD-10-CM | POA: Diagnosis not present

## 2020-12-25 MED ORDER — OMEPRAZOLE 40 MG PO CPDR: 40.0000 mg | DELAYED_RELEASE_CAPSULE | Freq: Every day | ORAL | 5 refills | Status: DC

## 2020-12-25 MED ORDER — PREDNISONE 20 MG PO TABS: ORAL_TABLET | ORAL | 0 refills | Status: DC

## 2020-12-25 MED ORDER — OMEPRAZOLE 40 MG PO CPDR: 40.0000 mg | DELAYED_RELEASE_CAPSULE | Freq: Two times a day (BID) | ORAL | 5 refills | Status: DC

## 2020-12-25 NOTE — Progress Notes (Signed)
Donna Mahoney    [id number]    01-28-1966  Primary Care Physician:Reade, Herbie [place], MD  Referring Physician: Maury Dus, [place],  [place] [address]  Chief complaint: Follow up for asthma  HPI: 55 year old with history of eczema, plantar fasciitis Sent here for evaluation of persistent wheezing Chest seen by primary care in March with wheezing. Started on Singulair with no improvement in symptoms.  This was stopped on follow-up visit and April and started on Symbicort 80/4.5.  Patient states that she still symptomatic with daily symptoms of cough, white mucus, wheezing.  She does have seasonal allergies for which she is taking Xyzal and acid reflux.  Pets: No pets Occupation: Games developer Exposures: No known exposures, no mold, hot tub, Jacuzzi Smoking history: 10-pack-year smoker.  Quit in 2017 Travel history: No significant travel history Relevant family history: No significant family history of lung disease  Interim history: Symbicort changed to Advair in 2021 since it was preferred by insurance  Developed COVID-19 in December 2021 Cough has gotten worse since then.  Cough is nonproductive in nature  Breathing is doing well with no increase in dyspnea wheezing She is using over-the-counter Prilosec.  Recent chest x-ray did not show acute abnormalities   Outpatient Encounter Medications as of 12/25/2020  Medication Sig  . albuterol (VENTOLIN HFA) 108 (90 Base) MCG/ACT inhaler TAKE 2 PUFFS BY MOUTH EVERY 6 HOURS AS NEEDED FOR WHEEZE OR SHORTNESS OF BREATH  . fluticasone (FLONASE) 50 MCG/ACT nasal spray Place 2 sprays into both nostrils daily.  . Fluticasone-Salmeterol (ADVAIR DISKUS) 250-50 MCG/DOSE AEPB Inhale 1 puff into the lungs 2 (two) times daily. Please rinse mouth out with water after each inhalation.  . montelukast (SINGULAIR) 10 MG tablet Take 10 mg by mouth daily.  . [DISCONTINUED] budesonide-formoterol  (SYMBICORT) 160-4.5 MCG/ACT inhaler Inhale 2 puffs into the lungs 2 (two) times daily.   No facility-administered encounter medications on file as of 12/25/2020.   Physical Exam: Blood pressure 130/72, pulse 100, temperature 98 F (36.7 C), temperature source Oral, height 5\' 7"  (1.702 m), weight 199 lb 6.4 oz (90.4 kg), SpO2 97 %. Gen:      No acute distress HEENT:  EOMI, sclera anicteric Neck:     No masses; no thyromegaly Lungs:    Bilateral expiratory wheeze CV:         Regular rate and rhythm; no murmurs Abd:      + bowel sounds; soft, non-tender; no palpable masses, no distension Ext:    No edema; adequate peripheral perfusion Skin:      Warm and dry; no rash Neuro: alert and oriented x 3 Psych: normal mood and affect  Data Reviewed: Imaging: Chest x-ray 05/25/2018- no acute cardiopulmonary disease. Chest x-ray 12/10/2020-no acute cardiopulmonary abnormality I have reviewed the images personally.  PFTs: 05/10/2019 FVC 1.84 [65%], FEV1 1.52 [68%], F/F 83, TLC 3.89 [76%], DLCO 14.6 [70%] Small airways disease with air trapping.  Mild restriction and diffusion impairment.  ACQ-6 03/16/2019- 3.5 ACT- 06/21/2019-21 ACT score 12/25/2020- 22  Labs: CBC 04/14/2009-WBC 6.9, eos 4.3%, absolute eosinophil 297 CBC 6-20 6/20-WBC 9, eos 6.4%, absolute eosinophil count 576 IgE 03/16/2019-88  Assessment:  Severe persistent asthma T2 high phenotype Continue advair, singulair  PFTs reviewed with no overt obstruction but evidence of small airways disease given curvature, improvement in mid flow lesions and air trapping.  There is mild restriction in diffusion impairment however no ILD on chest  x-ray  Cough Worsened after recent COVID-19 infection Suspect mostly upper airway cough with postnasal drip and cold Continue Flonase.  Start chlorpheniramine antihistamine over-the-counter Prescribe Prilosec twice daily  Prednisone 40 mg a day for 5 days  Schedule PFTs.  If there is any worsening  then consider high-res CT May need asthma biologics if cough is persistent  OSA  On CPAP, managed by Dr. Maxwell Caul from [place] She has been compliant with medication  Health maintenance Up-to-date  Plan/Recommendations: - Advair, Singulair - Flonase, chlorpheniramine, Prilosec twice daily - Prednisone for 5 days - Follow-up with PFTs  Marshell Garfinkel MD [facility] 12/25/2020, 10:29 AM  CC: Maury Dus, MD

## 2020-12-25 NOTE — Patient Instructions (Signed)
We will start you on Prilosec 40 mg twice daily Continue the advair Continue Flonase You can use chlorpheniramine 8 mg 3 times daily.  This medication is available over-the-counter Prednisone 40 mg a day for 5 days  Follow-up in 3 months with PFTs

## 2021-01-27 ENCOUNTER — Other Ambulatory Visit: Payer: Self-pay

## 2021-01-27 ENCOUNTER — Ambulatory Visit
Admission: RE | Admit: 2021-01-27 | Discharge: 2021-01-27 | Disposition: A | Discharge status: Home / Self Care | Payer: BC Managed Care – PPO | Source: Ambulatory Visit | Attending: Family Medicine | Admitting: Family Medicine

## 2021-01-27 DIAGNOSIS — Z1231 Encounter for screening mammogram for malignant neoplasm of breast: Secondary | ICD-10-CM

## 2021-03-16 ENCOUNTER — Other Ambulatory Visit: Payer: Self-pay

## 2021-03-16 ENCOUNTER — Ambulatory Visit (INDEPENDENT_AMBULATORY_CARE_PROVIDER_SITE_OTHER): Payer: BC Managed Care – PPO | Admitting: Podiatry

## 2021-03-16 DIAGNOSIS — R0600 Dyspnea, unspecified: Secondary | ICD-10-CM | POA: Insufficient documentation

## 2021-03-16 DIAGNOSIS — J4540 Moderate persistent asthma, uncomplicated: Secondary | ICD-10-CM | POA: Insufficient documentation

## 2021-03-16 DIAGNOSIS — J302 Other seasonal allergic rhinitis: Secondary | ICD-10-CM | POA: Insufficient documentation

## 2021-03-16 DIAGNOSIS — R000 Tachycardia, unspecified: Secondary | ICD-10-CM | POA: Insufficient documentation

## 2021-03-16 DIAGNOSIS — D171 Benign lipomatous neoplasm of skin and subcutaneous tissue of trunk: Secondary | ICD-10-CM | POA: Insufficient documentation

## 2021-03-16 DIAGNOSIS — G4733 Obstructive sleep apnea (adult) (pediatric): Secondary | ICD-10-CM | POA: Insufficient documentation

## 2021-03-16 DIAGNOSIS — M62830 Muscle spasm of back: Secondary | ICD-10-CM | POA: Insufficient documentation

## 2021-03-16 DIAGNOSIS — L600 Ingrowing nail: Secondary | ICD-10-CM | POA: Diagnosis not present

## 2021-03-16 DIAGNOSIS — F17201 Nicotine dependence, unspecified, in remission: Secondary | ICD-10-CM | POA: Insufficient documentation

## 2021-03-16 DIAGNOSIS — M25519 Pain in unspecified shoulder: Secondary | ICD-10-CM | POA: Insufficient documentation

## 2021-03-16 DIAGNOSIS — K219 Gastro-esophageal reflux disease without esophagitis: Secondary | ICD-10-CM | POA: Insufficient documentation

## 2021-03-16 DIAGNOSIS — B351 Tinea unguium: Secondary | ICD-10-CM | POA: Insufficient documentation

## 2021-03-16 DIAGNOSIS — R0683 Snoring: Secondary | ICD-10-CM | POA: Insufficient documentation

## 2021-03-16 DIAGNOSIS — D72829 Elevated white blood cell count, unspecified: Secondary | ICD-10-CM | POA: Insufficient documentation

## 2021-03-16 MED ORDER — GENTAMICIN SULFATE 0.1 % EX CREA: 1.0000 "application " | TOPICAL_CREAM | Freq: Two times a day (BID) | CUTANEOUS | 1 refills | Status: AC

## 2021-03-16 NOTE — Progress Notes (Signed)
   Subjective: Patient presents today for evaluation of pain to the lateral aspect right great toe. Patient is concerned for possible ingrown nail.  It is very sensitive to touch.  Patient presents today for further treatment and evaluation.  No past medical history on file.  Objective:  General: Well developed, nourished, in no acute distress, alert and oriented x3   Dermatology: Skin is warm, dry and supple bilateral.  Lateral aspect right great toe appears to be erythematous with evidence of an ingrowing nail. Pain on palpation noted to the border of the nail fold. The remaining nails appear unremarkable at this time. There are no open sores, lesions.  Vascular: Dorsalis Pedis artery and Posterior Tibial artery pedal pulses palpable. No lower extremity edema noted.   Neruologic: Grossly intact via light touch bilateral.  Musculoskeletal: Muscular strength within normal limits in all groups bilateral. Normal range of motion noted to all pedal and ankle joints.   Assesement: #1 Paronychia with ingrowing nail lateral aspect right great toe #2 Pain in toe  Plan of Care:  1. Patient evaluated.  2. Discussed treatment alternatives and plan of care. Explained nail avulsion procedure and post procedure course to patient. 3. Patient opted for permanent partial nail avulsion of the lateral aspect right great toe.  4. Prior to procedure, local anesthesia infiltration utilized using 3 ml of a 50:50 mixture of 2% plain lidocaine and 0.5% plain marcaine in a normal hallux block fashion and a betadine prep performed.  5. Partial permanent nail avulsion with chemical matrixectomy performed using [id number] applications of phenol followed by alcohol flush.  6. Light dressing applied.  Post care instructions provided 7.  Prescription for gentamicin 2% cream  8.  Return to clinic 2 weeks.  *Works as a Museum/gallery conservator of the blind  Edrick Kins, DPM [facility]  Dr. Edrick Kins, DPM    [address], [place] [address]                Office [phone]  Fax [phone]

## 2021-03-18 ENCOUNTER — Other Ambulatory Visit: Payer: Self-pay

## 2021-03-18 ENCOUNTER — Encounter: Payer: Self-pay | Admitting: Dietician

## 2021-03-18 ENCOUNTER — Encounter: Payer: BC Managed Care – PPO | Attending: Physician Assistant | Admitting: Dietician

## 2021-03-18 NOTE — Patient Instructions (Addendum)
Keep up the great work at the gym!! Be sure to have a small meal with some carbs about an hour before you exercise.  Begin to take your "Lift-Off" supplement about 30 minutes before you exercise instead of before your dinner meal.  Work towards eating three meals a day, about 5-6 hours apart!  Begin to build your meals using the proportions of the Balanced Plate. First, select your carb choice(s) for the meal  Next, select your source of protein to pair with your carb choice(s). Finally, complete the remaining half of your meal with a variety of non-starchy vegetables.

## 2021-03-18 NOTE — Progress Notes (Signed)
Medical Nutrition Therapy  Appointment Start time:  0800  Appointment End time:  0900  Primary concerns today: Weight loss  Referral diagnosis: E66.01 Morbid Obesity Preferred learning style: No preference indicated Learning readiness: Change in progress   NUTRITION ASSESSMENT   Anthropometrics  Ht: 5'7" Wt: 209.8 lbs Body mass index is 32.78 kg/m.   Clinical Medical Hx: GERD, OSA, Elevated A1c, Tobacco use ~30 pack years (Quit 2017) Medications: Prilosec Labs: TC - 208, LDL - 136, A1c - 6.0 Notable Signs/Symptoms: "Knots" in stomach   Lifestyle & Dietary Hx Pt reports a goal of weight loss to get under 200 lbs. Pt reports feeling knots in there stomach after they eat, pt can palpate their abdomen and feel small protrusions. Pt states this happens daily but does not cause discomfort. Pt exercises frequently. Pt will walk and do core exercises like crunches or hulahooping. Pt goes to the gym right after work, around 4:30, but does not eat anything prior to exercising. Pt reports having only a Slimfast for breakfast, usually about 10:00 am. Pt goes to work at 8:00 am, but does not have any breakfast prior to work.  Pt reports eating their fish and chicken fried, is willing to reduce their intake of fried foods. Pt reports taking multiple metabolism boosting supplements, states some cause GI upset.   Estimated daily fluid intake: 128 oz Supplements: Herbalife green tea pills, Daily MV Sleep: OSA, using CPAP which has improved sleep Stress / self-care: Stress over difficulty losing weight Current average weekly physical activity: Goes to gym 4x a week for 60 minutes, walks/resistance training.  24-Hr Dietary Recall First Meal: Slim Fast  Snack: none Second Meal: Bowl of salad, bowl of fruit Snack: none Third Meal: Grilled chicken, mixed vegetables Snack: none Beverages: cucumber water   NUTRITION DIAGNOSIS  NB-1.1 Food and nutrition-related knowledge deficit As related  to obesity.  As evidenced by BMI of 32.78 kg/m2, inconsistent meal timing and composition, no previous nutrition education, and self-reported frustration with not being able to lose weight on current diet.Marland Kitchen   NUTRITION INTERVENTION  Nutrition education (E-1) on the following topics:  Educated patient on the balanced plate eating model. Recommended lunch and dinner be 1/2 non-starchy vegetables, 1/4 starches, and 1/4 protein. Recommended breakfast be a balance of starch and protein with a piece of fruit. Discussed with patient the importance of working towards hitting the proportions of the balanced plate consistently. Counseled patient on ways to begin recognizing each of the food groups from the balanced plate in their own meals, and how close they are to fitting the recommended proportions of the balanced plate. Educated patient on the nutritional value of each food group on the balanced plate model.  Educated pt on the importance of eating a small meal or snack containing carbohydrate about an hour before going to the gym to fuel their exercise.   Handouts Provided Include  Balanced Plate Balanced Plate food list  Learning Style & Readiness for Change Teaching method utilized: Visual & Auditory  Demonstrated degree of understanding via: Teach Back  Barriers to learning/adherence to lifestyle change: none  Goals Established by Pt Keep up the great work at the gym!! Be sure to have a small meal with some carbs about an hour before you exercise. Begin to take your "Lift-Off" supplement about 30 minutes before you exercise instead of before your dinner meal. Work towards eating three meals a day, about 5-6 hours apart! Begin to build your meals using the proportions of  the Balanced Plate. First, select your carb choice(s) for the meal  Next, select your source of protein to pair with your carb choice(s). Finally, complete the remaining half of your meal with a variety of non-starchy  vegetables.   MONITORING & EVALUATION Dietary intake, weekly physical activity, and weight loss in 8 weeks.  Next Steps  Patient is to balance their meals and follow up with RDN.

## 2021-04-06 ENCOUNTER — Ambulatory Visit (INDEPENDENT_AMBULATORY_CARE_PROVIDER_SITE_OTHER): Payer: BC Managed Care – PPO | Admitting: Podiatry

## 2021-04-06 ENCOUNTER — Other Ambulatory Visit: Payer: Self-pay

## 2021-04-06 DIAGNOSIS — L600 Ingrowing nail: Secondary | ICD-10-CM | POA: Diagnosis not present

## 2021-04-06 NOTE — Progress Notes (Signed)
   Subjective: 55 y.o. female presents today status post permanent nail avulsion procedure of the lateral border right great toe that was performed on 03/16/2021.  Patient states that she is doing very well.  She states that discomfort of the toe has resolved.  No new complaints at this time.   No past medical history on file.  Objective: Skin is warm, dry and supple. Nail and respective nail fold appears to be healing appropriately. Open wound to the associated nail fold with a granular wound base and moderate amount of fibrotic tissue. Minimal drainage noted. Mild erythema around the periungual region likely due to phenol chemical matricectomy.  Assessment: #1 s/p partial permanent nail matrixectomy lateral border right great toe   Plan of care: #1 patient was evaluated  #2 light debridement of open wound was performed to the periungual border of the respective toe using a currette. Antibiotic ointment and Band-Aid was applied. #3 patient is to return to clinic on a PRN basis.   Edrick Kins, DPM [facility]  Dr. Edrick Kins, DPM    [address], [place] [address]                Office [phone]  Fax [phone]

## 2021-04-28 ENCOUNTER — Ambulatory Visit (INDEPENDENT_AMBULATORY_CARE_PROVIDER_SITE_OTHER): Payer: BC Managed Care – PPO | Admitting: Pulmonary Disease

## 2021-04-28 ENCOUNTER — Encounter: Payer: Self-pay | Admitting: Primary Care

## 2021-04-28 ENCOUNTER — Other Ambulatory Visit: Payer: Self-pay

## 2021-04-28 ENCOUNTER — Ambulatory Visit (INDEPENDENT_AMBULATORY_CARE_PROVIDER_SITE_OTHER): Payer: BC Managed Care – PPO | Admitting: Primary Care

## 2021-04-28 DIAGNOSIS — J4550 Severe persistent asthma, uncomplicated: Secondary | ICD-10-CM | POA: Diagnosis not present

## 2021-04-28 DIAGNOSIS — R059 Cough, unspecified: Secondary | ICD-10-CM | POA: Diagnosis not present

## 2021-04-28 LAB — PULMONARY FUNCTION TEST
DL/VA % pred: 85 %
DL/VA: 3.65 ml/min/mmHg/L
DLCO cor % pred: 67 %
DLCO cor: 13.9 ml/min/mmHg
DLCO unc % pred: 67 %
DLCO unc: 13.9 ml/min/mmHg
FEF 25-75 Post: 3.45 L/sec
FEF 25-75 Pre: 2.77 L/sec
FEF2575-%Change-Post: 24 %
FEF2575-%Pred-Post: 154 %
FEF2575-%Pred-Pre: 124 %
FEV1-%Change-Post: 4 %
FEV1-%Pred-Post: 97 %
FEV1-%Pred-Pre: 93 %
FEV1-Post: 2.14 L
FEV1-Pre: 2.04 L
FEV1FVC-%Change-Post: 2 %
FEV1FVC-%Pred-Pre: 108 %
FEV6-%Change-Post: 2 %
FEV6-%Pred-Post: 89 %
FEV6-%Pred-Pre: 88 %
FEV6-Post: 2.4 L
FEV6-Pre: 2.35 L
FEV6FVC-%Pred-Post: 103 %
FEV6FVC-%Pred-Pre: 103 %
FVC-%Change-Post: 2 %
FVC-%Pred-Post: 87 %
FVC-%Pred-Pre: 85 %
FVC-Post: 2.4 L
FVC-Pre: 2.35 L
Post FEV1/FVC ratio: 89 %
Post FEV6/FVC ratio: 100 %
Pre FEV1/FVC ratio: 87 %
Pre FEV6/FVC Ratio: 100 %
RV % pred: 103 %
RV: 1.97 L
TLC % pred: 93 %
TLC: 4.71 L

## 2021-04-28 MED ORDER — MONTELUKAST SODIUM 10 MG PO TABS: 10.0000 mg | ORAL_TABLET | Freq: Every day | ORAL | 11 refills | Status: AC

## 2021-04-28 NOTE — Assessment & Plan Note (Addendum)
-   Pulmonary function testing appears stable, if not better compared to August 2020. She did, however, use Advair prior to testing. Her cough is not currently bothering her, tends to flare seasonally. She is compliant with Advair 250-25mg one puff twice daily. She rarely requires SABA. ACT score was 24 today. She is not currently taking Singulair. Recommend resuming Singulair '10mg'$  at bedtime and continue flonase + Xyzal as needed. I will discuss with Dr. MVaughan Brownerabout whether or not he wants CT imaging d/t hx covid.

## 2021-04-28 NOTE — Patient Instructions (Signed)
Nice meeting you today Donna Mahoney  Pulmonary function testing looks improved when compared to 2020, however, you did use Advair prior to testing which could be why. I will discuss with Dr. Vaughan Browner if he wants any further testing or imaging- unlikely that he will right now  Recommendations: Continue Advair 1 puff morning and evening Continue Prilosec '40mg'$  twice daily Continue Flonase nasal spray as needed Continue Xyzal as needed  Resume Singulair '10mg'$  at night   Follow-up: 6 months with Dr. Vaughan Browner /  Call our office if develop worsening cough, chest tightness, wheezing or shortness of breath

## 2021-04-28 NOTE — Progress Notes (Signed)
$'@Patient'L$  ID: Donna Mahoney, female    DOB: [birth year], 55 y.o.   MRN: [id number]  Chief Complaint  Patient presents with   Follow-up    PFT review, no concerns today.     Referring provider: Maury Dus, MD  HPI: 55 year old female, former smoker quit in June 2017 (6-pack-year history).  Past medical history significant for severe persistent asthma, obstructive sleep apnea, GERD, lipoma, tachycardia.  Patient of Dr. Vaughan Browner, last seen in office on 12/25/2020.  Ordered for repeat PFT, if any worsening of lung function recommend getting high-resolution CAT scan/ may need asthma Biologics if cough persist. OSA managed by Dr. Elenore Rota from [place].   04/28/2021 Patient presents today for 32-monthfollow-up with PFTs.  Patient had a worsening cough after recent COVID-19 infection.  Cough was felt to be more related to upper airway cough with postnasal drip.  During her last office visit with Dr. MVaughan Brownershe was started on Prilosec 40 mg twice daily, chlorphentermine and was given 5-day course of oral prednisone.    Cough is pretty well controlled at the moment, tends to flare seasonally. She is maintained on Advair 250-521m 1 puff twice a day. She is not currently taking Singulair or Xyzal. She uses flonase as needed. Act 24    Pulmonary function testing: 04/28/21 PFT - post BD FVC 2.40 (87%), 2.14 (97%), ratio 89, TLC 93%, DLCOunc 13.90 (67%) Interpretation: Normal spirometry, moderate diffusion defect / *Patient used Advair 2 hours prior   05/10/19 PFT- post BD FVC 1.84 (65%), FEV1 1.52 (68%), ratio 83   *Patient did not use any inhalers before testing  Allergies  Allergen Reactions   Codeine Nausea And Vomiting    Immunization History  Administered Date(s) Administered   Influenza, Quadrivalent, Recombinant, Inj, Pf 07/15/2020   Influenza,inj,Quad PF,6+ Mos 05/12/2017, 06/21/2019   Influenza,inj,Quad PF,6-35 Mos 06/01/2018   Influenza,inj,quad, With Preservative 05/03/2016    Influenza-Unspecified 05/20/2017, 06/15/2018   Moderna Sars-Covid-2 Vaccination 12/10/2019, 01/07/2020   PFIZER(Purple Top)SARS-COV-2 Vaccination 12/10/2019, 01/07/2020   Pneumococcal Polysaccharide-23 06/21/2019   Td 05/12/2017   Tdap 01/04/2007   Zoster, Live 11/13/2020    No past medical history on file.  Tobacco History: Social History   Tobacco Use  Smoking Status Former   Packs/day: 0.25   Years: 23.00   Pack years: 5.75   Types: Cigarettes   Quit date: 03/19/2016   Years since quitting: 5.1  Smokeless Tobacco Never   Counseling given: Not Answered   Outpatient Medications Prior to Visit  Medication Sig Dispense Refill   acetaminophen (TYLENOL) 325 MG tablet 2 tablets     albuterol (VENTOLIN HFA) 108 (90 Base) MCG/ACT inhaler TAKE 2 PUFFS BY MOUTH EVERY 6 HOURS AS NEEDED FOR WHEEZE OR SHORTNESS OF BREATH 18 g 0   benzonatate (TESSALON) 200 MG capsule Take 200 mg by mouth 3 (three) times daily.     cyclobenzaprine (FLEXERIL) 10 MG tablet 1/2-1 tablet     fluticasone (FLONASE) 50 MCG/ACT nasal spray Place 2 sprays into both nostrils daily. 16 g 5   Fluticasone-Salmeterol (ADVAIR DISKUS) 250-50 MCG/DOSE AEPB Inhale 1 puff into the lungs 2 (two) times daily. Please rinse mouth out with water after each inhalation. 60 each 11   gentamicin cream (GARAMYCIN) 0.1 % Apply 1 application topically 2 (two) times daily. 30 g 1   levocetirizine (XYZAL) 5 MG tablet 1 tablet in the evening     naproxen sodium (ALEVE) 220 MG tablet 2 tablets     omeprazole (PRILOSEC) 40  MG capsule TAKE 1 CAPSULE BY MOUTH TWICE A DAY 60 capsule 5   montelukast (SINGULAIR) 10 MG tablet Take 10 mg by mouth daily.     amoxicillin-clavulanate (AUGMENTIN) 875-125 MG tablet SMARTSIG:1 Tablet(s) By Mouth Every 12 Hours (Patient not taking: Reported on 04/28/2021)     predniSONE (DELTASONE) 20 MG tablet Take '40mg'$  daily for 5 days (Patient not taking: Reported on 04/28/2021) 10 tablet 0   traMADol (ULTRAM) 50 MG  tablet Take 50-100 mg by mouth every 6 (six) hours as needed. (Patient not taking: Reported on 04/28/2021)     valACYclovir (VALTREX) 1000 MG tablet      No facility-administered medications prior to visit.    Review of Systems  Review of Systems  Constitutional: Negative.   HENT: Negative.    Respiratory:  Negative for cough, chest tightness, shortness of breath and wheezing.     Physical Exam  BP 120/70 (BP Location: Left Arm, Cuff Size: Normal)   Pulse 88   Temp 98.2 F (36.8 C) (Oral)   Ht '5\' 4"'$  (1.626 m)   Wt 213 lb 6.4 oz (96.8 kg)   SpO2 93%   BMI 36.63 kg/m  Physical Exam Constitutional:      Appearance: Normal appearance.  HENT:     Head: Normocephalic and atraumatic.     Mouth/Throat:     Mouth: Mucous membranes are moist.     Pharynx: Oropharynx is clear.  Cardiovascular:     Rate and Rhythm: Normal rate and regular rhythm.  Pulmonary:     Effort: Pulmonary effort is normal.     Breath sounds: No wheezing or rhonchi.  Musculoskeletal:        General: Normal range of motion.  Skin:    General: Skin is warm and dry.  Neurological:     General: No focal deficit present.     Mental Status: She is alert and oriented to person, place, and time. Mental status is at baseline.  Psychiatric:        Mood and Affect: Mood normal.        Behavior: Behavior normal.        Thought Content: Thought content normal.     Lab Results:  CBC    Component Value Date/Time   WBC 9.0 03/16/2019 1619   RBC 4.55 03/16/2019 1619   HGB 13.6 03/16/2019 1619   HGB 11.9 04/14/2009 0934   HCT 41.5 03/16/2019 1619   HCT 35.8 04/14/2009 0934   PLT 175 03/16/2019 1619   PLT 145 04/14/2009 0934   MCV 91.2 03/16/2019 1619   MCV 90.9 04/14/2009 0934   MCH 29.9 03/16/2019 1619   MCHC 32.8 03/16/2019 1619   RDW 13.3 03/16/2019 1619   RDW 13.8 04/14/2009 0934   LYMPHSABS 3,843 03/16/2019 1619   LYMPHSABS 2.7 04/14/2009 0934   MONOABS 0.4 04/14/2009 0934   EOSABS 576 (H)  03/16/2019 1619   EOSABS 0.3 04/14/2009 0934   BASOSABS 90 03/16/2019 1619   BASOSABS 0.1 04/14/2009 0934    BMET    Component Value Date/Time   NA 140 04/14/2009 0934   K 4.4 04/14/2009 0934   CL 110 04/14/2009 0934   CO2 20 04/14/2009 0934   GLUCOSE 86 04/14/2009 0934   BUN 10 04/14/2009 0934   CREATININE 0.91 04/14/2009 0934   CALCIUM 9.0 04/14/2009 0934    BNP No results found for: BNP  ProBNP No results found for: PROBNP  Imaging: No results found.   Assessment & Plan:  Severe persistent asthma - Pulmonary function testing appears stable, if not better compared to August 2020. She did, however, use Advair prior to testing. Her cough is not currently bothering her, tends to flare seasonally. She is compliant with Advair 250-67mg one puff twice daily. She rarely requires SABA. ACT score was 24 today. She is not currently taking Singulair. Recommend resuming Singulair '10mg'$  at bedtime and continue flonase + Xyzal as needed. I will discuss with Dr. MVaughan Brownerabout whether or not he wants CT imaging d/t hx covid.      EMartyn Ehrich NP 04/28/2021

## 2021-04-28 NOTE — Progress Notes (Signed)
Full PFT completed today ? ?

## 2021-05-13 ENCOUNTER — Ambulatory Visit: Payer: BC Managed Care – PPO | Admitting: Dietician

## 2021-06-02 ENCOUNTER — Other Ambulatory Visit: Payer: Self-pay | Admitting: Family Medicine

## 2021-06-02 DIAGNOSIS — R1900 Intra-abdominal and pelvic swelling, mass and lump, unspecified site: Secondary | ICD-10-CM

## 2021-06-12 ENCOUNTER — Ambulatory Visit
Admission: RE | Admit: 2021-06-12 | Discharge: 2021-06-12 | Disposition: A | Discharge status: Home / Self Care | Payer: BC Managed Care – PPO | Source: Ambulatory Visit | Attending: Family Medicine | Admitting: Family Medicine

## 2021-06-12 DIAGNOSIS — R1900 Intra-abdominal and pelvic swelling, mass and lump, unspecified site: Secondary | ICD-10-CM

## 2021-06-22 ENCOUNTER — Ambulatory Visit: Payer: BC Managed Care – PPO | Admitting: Dietician

## 2021-10-29 IMAGING — MG MM DIGITAL SCREENING BILAT W/ TOMO AND CAD
6 of 10 series · 6 of 30 positions shown · non-contrast
Comparison: Previous exam(s).

CLINICAL DATA: Screening.

EXAM:
DIGITAL SCREENING BILATERAL MAMMOGRAM WITH TOMOSYNTHESIS AND CAD
TECHNIQUE: Bilateral screening digital craniocaudal and mediolateral oblique
mammograms were obtained. Bilateral screening digital breast
tomosynthesis was performed. The images were evaluated with
computer-aided detection.

[R MLO synth-2D (1 of 2)]
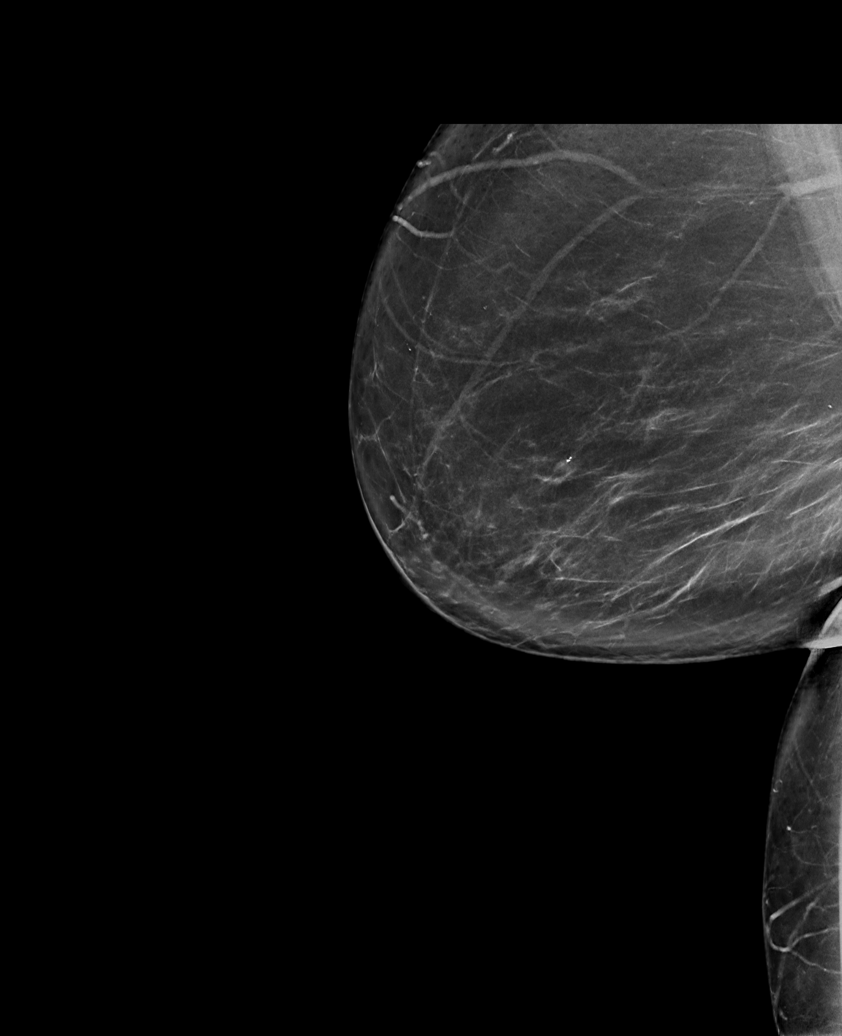

[L CC synth-2D]
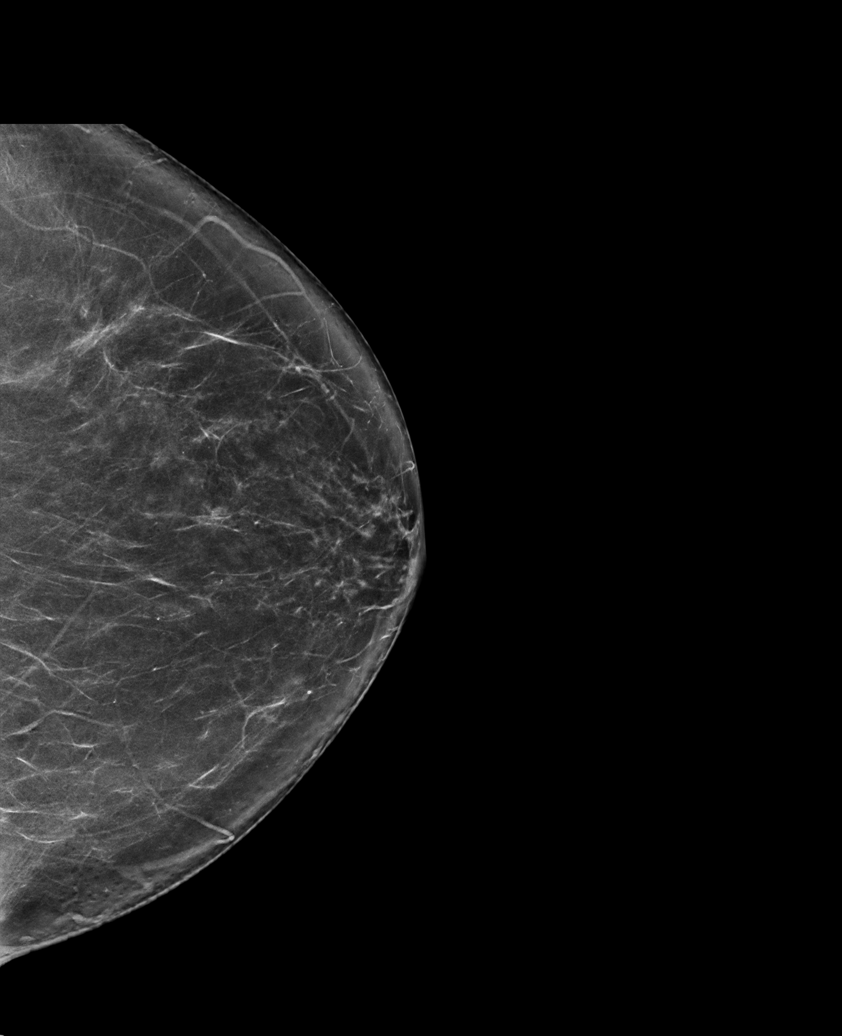

[L MLO synth-2D]
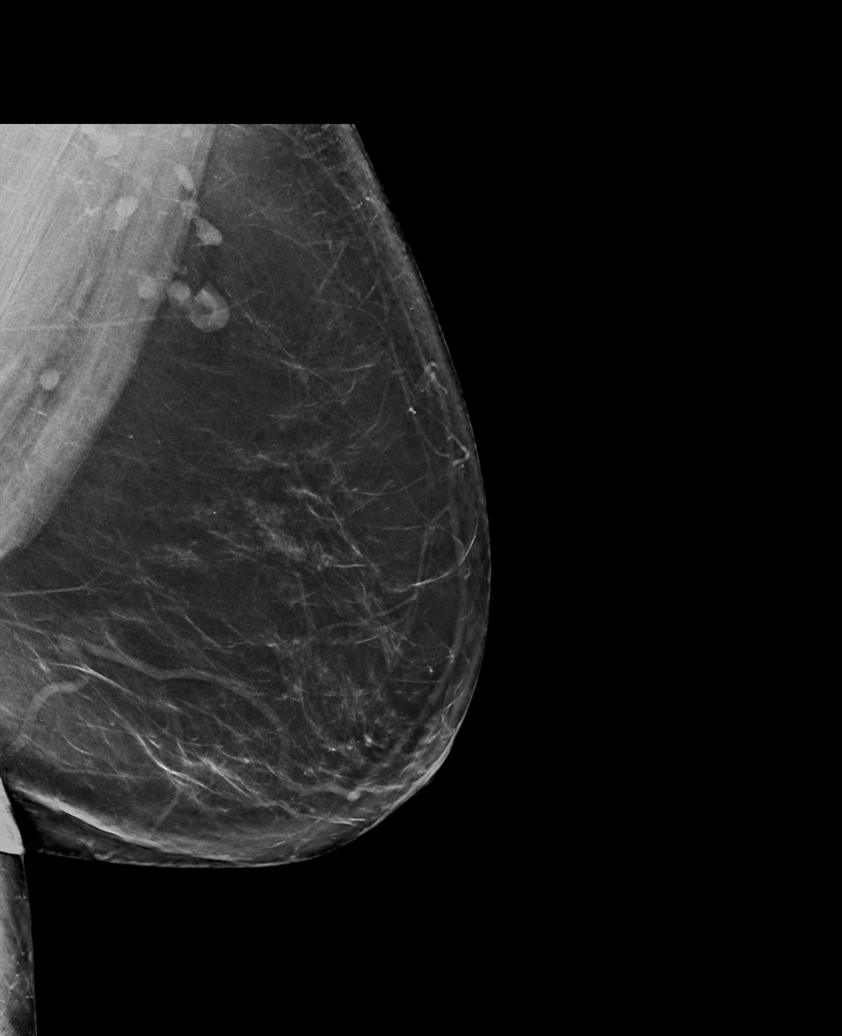

[R CC synth-2D]
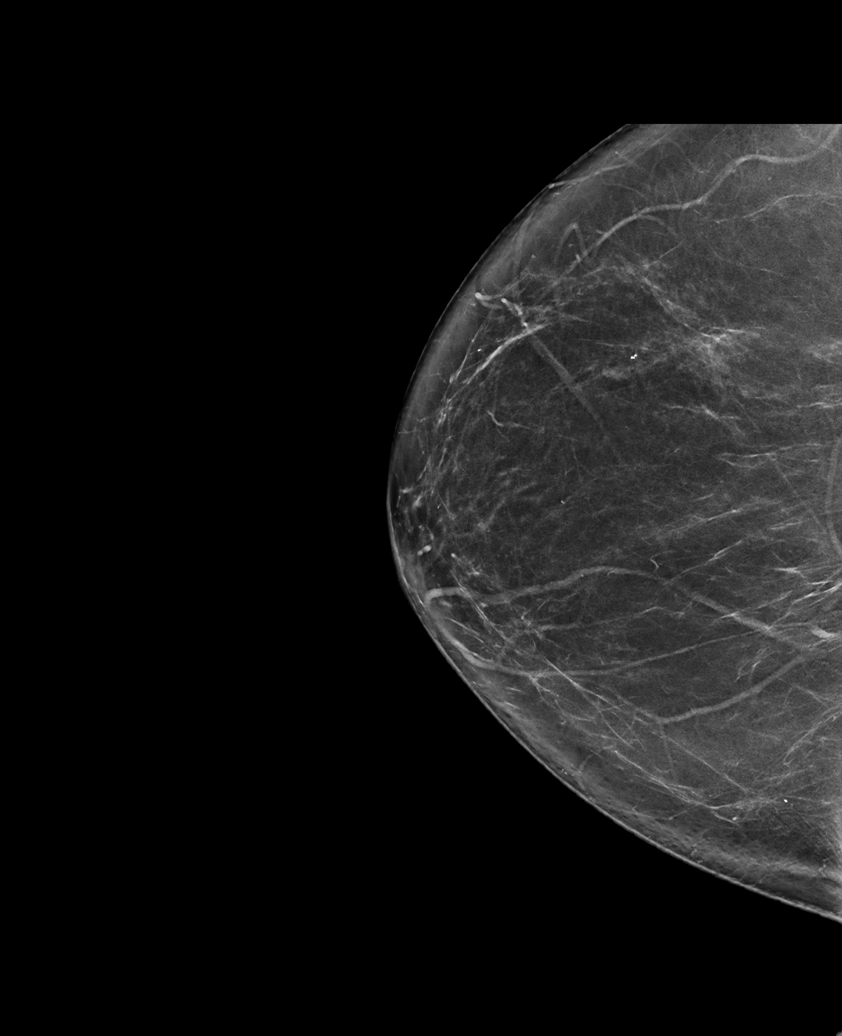

[R MLO synth-2D (2 of 2)]
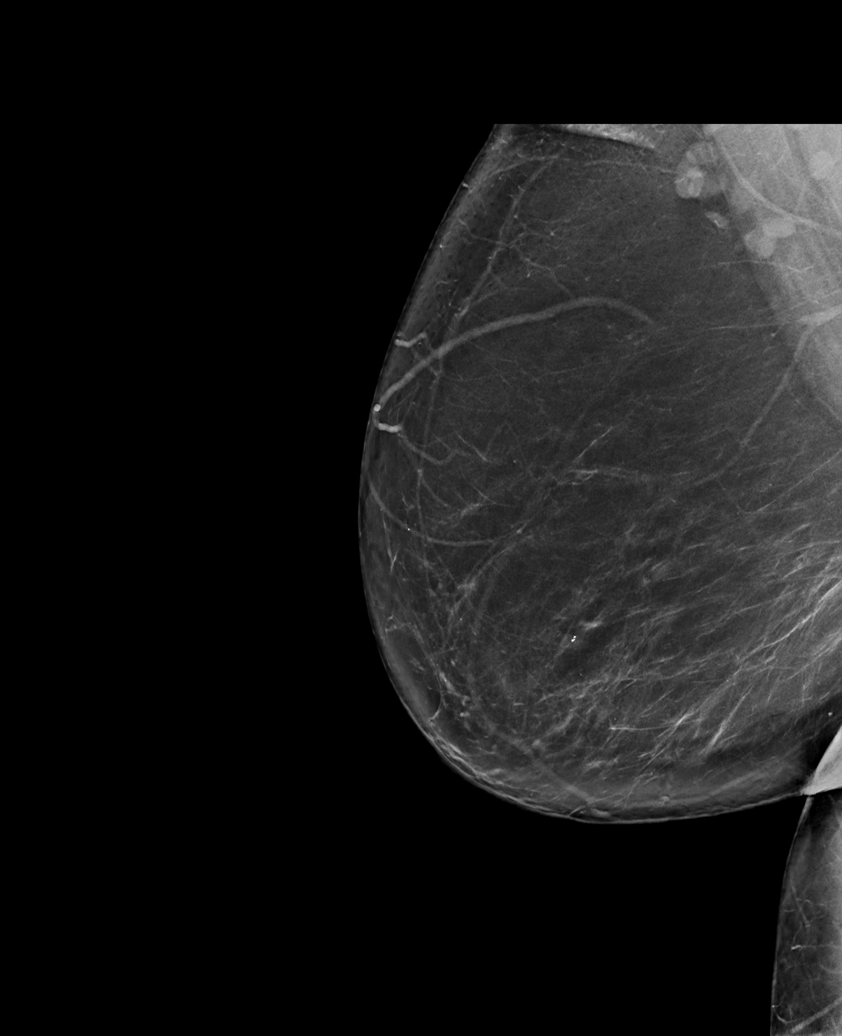

[L CC tomo · tomo slice 41/81.0]
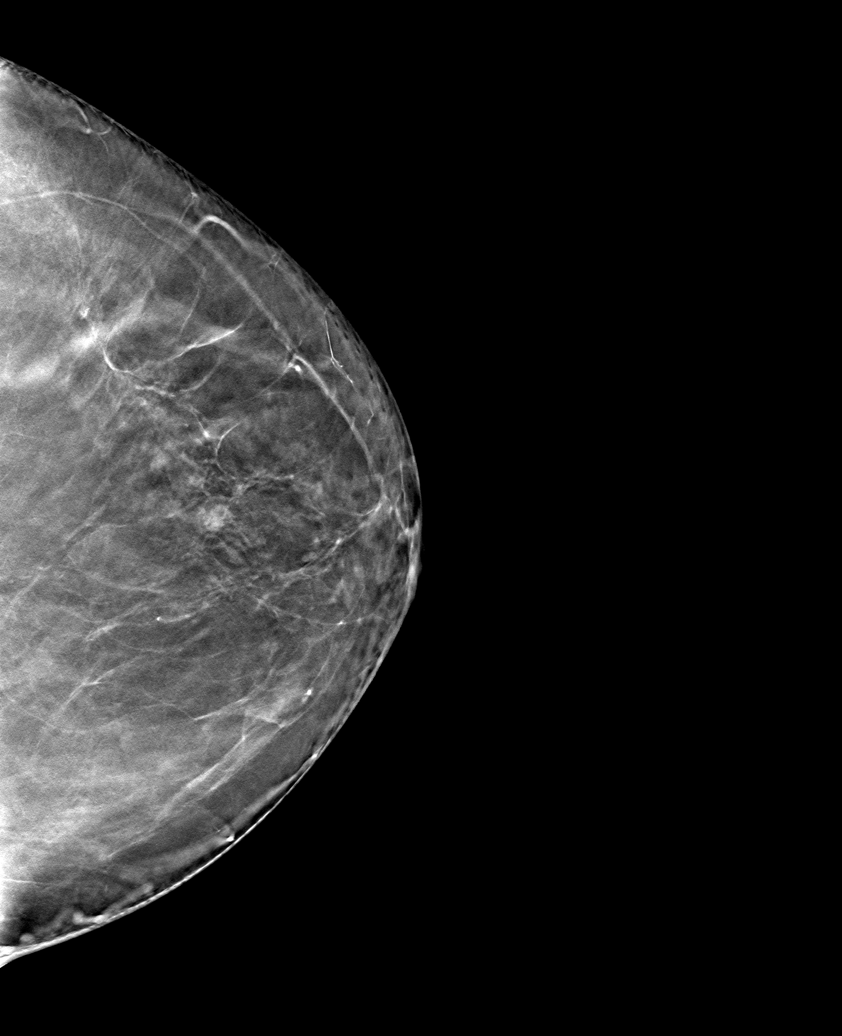

[6 of 30 positions shown; findings below may reference images not displayed]

ACR Breast Density Category b: There are scattered areas of
fibroglandular density.
FINDINGS: There are no findings suspicious for malignancy. The images were
evaluated with computer-aided detection.
IMPRESSION: No mammographic evidence of malignancy. A result letter of this
screening mammogram will be mailed directly to the patient.

RECOMMENDATION:
Screening mammogram in one year. (Code:[id number])

BI-RADS CATEGORY  1: Negative.

## 2022-07-06 ENCOUNTER — Other Ambulatory Visit: Payer: Self-pay | Admitting: Primary Care

## 2022-08-02 ENCOUNTER — Other Ambulatory Visit: Payer: Self-pay | Admitting: Physician Assistant

## 2022-08-02 DIAGNOSIS — R053 Chronic cough: Secondary | ICD-10-CM

## 2022-09-01 ENCOUNTER — Encounter: Payer: Self-pay | Admitting: Physician Assistant

## 2022-09-02 ENCOUNTER — Ambulatory Visit
Admission: RE | Admit: 2022-09-02 | Discharge: 2022-09-02 | Disposition: A | Discharge status: Home / Self Care | Payer: BC Managed Care – PPO | Source: Ambulatory Visit | Attending: Physician Assistant | Admitting: Physician Assistant

## 2022-09-02 DIAGNOSIS — R053 Chronic cough: Secondary | ICD-10-CM

## 2022-09-02 MED ORDER — IOPAMIDOL (ISOVUE-300) INJECTION 61%
100.0000 mL | Freq: Once | INTRAVENOUS | Status: AC | PRN
  Administered 2022-09-02: 100 mL via INTRAVENOUS

## 2023-03-10 ENCOUNTER — Other Ambulatory Visit: Payer: Self-pay | Admitting: Family Medicine

## 2023-03-10 DIAGNOSIS — Z1231 Encounter for screening mammogram for malignant neoplasm of breast: Secondary | ICD-10-CM

## 2023-03-30 ENCOUNTER — Ambulatory Visit
Admission: RE | Admit: 2023-03-30 | Discharge: 2023-03-30 | Disposition: A | Discharge status: Home / Self Care | Payer: 59 | Source: Ambulatory Visit | Attending: Family Medicine | Admitting: Family Medicine

## 2023-03-30 DIAGNOSIS — Z1231 Encounter for screening mammogram for malignant neoplasm of breast: Secondary | ICD-10-CM

## 2023-08-26 ENCOUNTER — Other Ambulatory Visit: Payer: Self-pay | Admitting: Family Medicine

## 2023-08-26 DIAGNOSIS — R911 Solitary pulmonary nodule: Secondary | ICD-10-CM

## 2023-09-07 ENCOUNTER — Encounter: Payer: Self-pay | Admitting: Family Medicine

## 2023-09-27 ENCOUNTER — Ambulatory Visit
Admission: RE | Admit: 2023-09-27 | Discharge: 2023-09-27 | Disposition: A | Discharge status: Home / Self Care | Payer: 59 | Source: Ambulatory Visit | Attending: Family Medicine | Admitting: Family Medicine

## 2023-09-27 DIAGNOSIS — R911 Solitary pulmonary nodule: Secondary | ICD-10-CM

## 2024-03-13 ENCOUNTER — Other Ambulatory Visit: Payer: Self-pay | Admitting: Family Medicine

## 2024-03-13 DIAGNOSIS — Z1231 Encounter for screening mammogram for malignant neoplasm of breast: Secondary | ICD-10-CM

## 2024-03-30 ENCOUNTER — Ambulatory Visit

## 2024-04-06 ENCOUNTER — Ambulatory Visit
Admission: RE | Admit: 2024-04-06 | Discharge: 2024-04-06 | Disposition: A | Discharge status: Home / Self Care | Source: Ambulatory Visit | Attending: Family Medicine | Admitting: Family Medicine

## 2024-04-06 DIAGNOSIS — Z1231 Encounter for screening mammogram for malignant neoplasm of breast: Secondary | ICD-10-CM
